# Patient Record
Sex: Male | Born: 1990 | Race: Black or African American | Hispanic: No | Marital: Single | Smoking: Current some day smoker
Health system: Southern US, Community
[De-identification: ages and names within clinical notes are randomized; demographics above are authoritative.]

## PROBLEM LIST (undated history)

## (undated) DIAGNOSIS — N289 Disorder of kidney and ureter, unspecified: Secondary | ICD-10-CM

## (undated) DIAGNOSIS — R519 Headache, unspecified: Secondary | ICD-10-CM

## (undated) DIAGNOSIS — M25569 Pain in unspecified knee: Secondary | ICD-10-CM

## (undated) DIAGNOSIS — Z87442 Personal history of urinary calculi: Secondary | ICD-10-CM

## (undated) DIAGNOSIS — R51 Headache: Secondary | ICD-10-CM

---
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
## 2012-04-28 ENCOUNTER — Emergency Department (HOSPITAL_COMMUNITY)
Admission: EM | Admit: 2012-04-28 | Discharge: 2012-04-28 | Disposition: A | Discharge status: Home / Self Care | Payer: BC Managed Care – PPO | Attending: Emergency Medicine | Admitting: Emergency Medicine

## 2012-04-28 ENCOUNTER — Encounter (HOSPITAL_COMMUNITY): Payer: Self-pay | Admitting: *Deleted

## 2012-04-28 ENCOUNTER — Emergency Department (HOSPITAL_COMMUNITY): Payer: BC Managed Care – PPO

## 2012-04-28 DIAGNOSIS — Z87442 Personal history of urinary calculi: Secondary | ICD-10-CM | POA: Insufficient documentation

## 2012-04-28 DIAGNOSIS — N201 Calculus of ureter: Secondary | ICD-10-CM

## 2012-04-28 DIAGNOSIS — Z8719 Personal history of other diseases of the digestive system: Secondary | ICD-10-CM | POA: Insufficient documentation

## 2012-04-28 LAB — URINALYSIS, ROUTINE W REFLEX MICROSCOPIC
Bilirubin Urine: NEGATIVE
Glucose, UA: NEGATIVE mg/dL
Ketones, ur: NEGATIVE mg/dL
Nitrite: NEGATIVE
pH: 6 (ref 5.0–8.0)

## 2012-04-28 LAB — BASIC METABOLIC PANEL
BUN: 12 mg/dL (ref 6–23)
Calcium: 10 mg/dL (ref 8.4–10.5)
GFR calc non Af Amer: >90 mL/min (ref >90)
Glucose, Bld: 94 mg/dL (ref 70–99)

## 2012-04-28 LAB — URINE MICROSCOPIC-ADD ON

## 2012-04-28 MED ORDER — MORPHINE SULFATE 4 MG/ML IJ SOLN
4.0000 mg | Freq: Once | INTRAMUSCULAR | Status: DC
  Filled 2012-04-28: qty 1

## 2012-04-28 MED ORDER — OXYCODONE-ACETAMINOPHEN 5-325 MG PO TABS: 2.0000 | ORAL_TABLET | ORAL | Status: DC | PRN

## 2012-04-28 MED ORDER — CIPROFLOXACIN HCL 500 MG PO TABS: 500.0000 mg | ORAL_TABLET | Freq: Two times a day (BID) | ORAL | Status: DC

## 2012-04-28 MED ORDER — ONDANSETRON HCL 4 MG/2ML IJ SOLN
4.0000 mg | Freq: Once | INTRAMUSCULAR | Status: AC
  Administered 2012-04-28: 4 mg via INTRAVENOUS
  Filled 2012-04-28: qty 2

## 2012-04-28 MED ORDER — SODIUM CHLORIDE 0.9 % IV BOLUS (SEPSIS)
1000.0000 mL | Freq: Once | INTRAVENOUS | Status: AC
  Administered 2012-04-28: 1000 mL via INTRAVENOUS

## 2012-04-28 NOTE — ED Notes (Signed)
JYN:[id number]<AO> Expected date:<BR> Expected time:<BR> Means of arrival:<BR> Comments:<BR> Kidney stone

## 2012-04-28 NOTE — ED Notes (Signed)
Patient complaining of left sided flank pain for about 5 day. States he has a history of gallstones  Seen a doctor about a year ago and and had a  diagnosis of  Gallstones. Patient states the pain is very similar 7/10.

## 2012-04-28 NOTE — ED Provider Notes (Signed)
History     CSN: [id number]  Arrival date & time 04/28/12  1004   First MD Initiated Contact with Patient 04/28/12 1012      Chief Complaint  Patient presents with  . Flank Pain    left side  hx of kidney     (Consider location/radiation/quality/duration/timing/severity/associated sxs/prior treatment) HPI Comments: Pt states that he has had a kidney stone in the past without complication:pt denies fever,vomiting, diarrhea:pt states that he has not seen urology in the past:left flank pain is radiating to the abdomen  Patient is a 22 y.o. male presenting with flank pain.  Flank Pain This is a recurrent problem. The current episode started in the past 7 days. The problem occurs intermittently. The problem has been unchanged. Associated symptoms include abdominal pain. Pertinent negatives include no fever or urinary symptoms. Nothing aggravates the symptoms. He has tried nothing for the symptoms.    Past Medical History  Diagnosis Date  . Gallstones     History reviewed. No pertinent past surgical history.  History reviewed. No pertinent family history.  History  Substance Use Topics  . Smoking status: Never Smoker   . Smokeless tobacco: Not on file  . Alcohol Use: 1.2 oz/week    2 Shots of liquor per week     Comment: patient states he drinks on occasion had 2 shots last night      Review of Systems  Constitutional: Negative for fever.  Respiratory: Negative.   Cardiovascular: Negative.   Gastrointestinal: Positive for abdominal pain.  Genitourinary: Positive for flank pain.    Allergies  Review of patient's allergies indicates no known allergies.  Home Medications  No current outpatient prescriptions on file.  BP 133/75  Pulse 84  Temp(Src) 97.6 F (36.4 C) (Oral)  Resp 18  SpO2 100%  Physical Exam  Nursing note and vitals reviewed. Constitutional: He is oriented to person, place, and time. He appears well-developed and well-nourished.  HENT:  Head:  Normocephalic and atraumatic.  Eyes: Conjunctivae are normal.  Neck: Neck supple.  Cardiovascular: Normal rate and regular rhythm.   Pulmonary/Chest: Effort normal and breath sounds normal.  Abdominal: Soft. Bowel sounds are normal. There is no tenderness.  Musculoskeletal: Normal range of motion.  Neurological: He is alert and oriented to person, place, and time.  Skin: Skin is warm and dry.  Psychiatric: He has a normal mood and affect.    ED Course  Procedures (including critical care time)  Labs Reviewed  URINALYSIS, ROUTINE W REFLEX MICROSCOPIC - Abnormal; Notable for the following:    APPearance CLOUDY (*)    Hgb urine dipstick LARGE (*)    Leukocytes, UA SMALL (*)    All other components within normal limits  URINE MICROSCOPIC-ADD ON - Abnormal; Notable for the following:    Bacteria, UA FEW (*)    All other components within normal limits  URINE CULTURE  BASIC METABOLIC PANEL   Ct Abdomen Pelvis Wo Contrast  04/28/2012  *RADIOLOGY REPORT*  Clinical Data: Flank pain  CT ABDOMEN AND PELVIS WITHOUT CONTRAST  Technique:  Multidetector CT imaging of the abdomen and pelvis was performed following the standard protocol without intravenous contrast.  Comparison: 09/14/2009  Findings: Lung bases are clear.  No pericardial or pleural effusion.  There is no liver abnormality.  The gallbladder is normal.  No biliary dilatation.  The pancreas is unremarkable. The spleen is normal.  Normal appearance of the adrenal glands.  The right kidney is normal.  No right-sided nephrolithiasis  or hydronephrosis.  No hydroureter or ureterolithiasis. There is asymmetric left-sided hydronephrosis.  At the left UPJ there is a 9 mm stone, image 32/series 2.  Urinary bladder appears normal.  Prostate gland and seminal vesicles are unremarkable.  The abdominal aorta has a normal caliber.  There is no enlarged lymph nodes within the upper abdomen or pelvis.  No free fluid or fluid collections noted.  The stomach  and the small bowel loops have a normal course and caliber.  The appendix is visualized and appears normal.  The colon is unremarkable.  Review of the visualized osseous structures is unremarkable.  IMPRESSION:  1.  Left UPJ stone measures 9 mm and results in left-sided hydronephrosis.   Original Report Authenticated By: Signa Kell, M.D.      1. Ureteral stone       MDM  Pt is feeling better at this time:pt is okay to follow up with urology:possible early infection:will treat and send urine for culture        Teressa Lower, NP 04/28/12 1145

## 2012-04-28 NOTE — ED Notes (Signed)
Patient transported to CT 

## 2012-04-28 NOTE — ED Provider Notes (Signed)
Medical screening examination/treatment/procedure(s) were performed by non-physician practitioner and as supervising physician I was immediately available for consultation/collaboration.   Richardean Canal, MD 04/28/12 1249

## 2012-04-29 LAB — URINE CULTURE: Colony Count: 4000

## 2012-07-08 ENCOUNTER — Encounter (HOSPITAL_COMMUNITY): Payer: Self-pay

## 2012-07-08 ENCOUNTER — Emergency Department (HOSPITAL_COMMUNITY)
Admission: EM | Admit: 2012-07-08 | Discharge: 2012-07-08 | Disposition: A | Discharge status: Home / Self Care | Payer: BC Managed Care – PPO | Attending: Emergency Medicine | Admitting: Emergency Medicine

## 2012-07-08 DIAGNOSIS — N200 Calculus of kidney: Secondary | ICD-10-CM

## 2012-07-08 LAB — URINALYSIS, ROUTINE W REFLEX MICROSCOPIC
Glucose, UA: NEGATIVE mg/dL
Hgb urine dipstick: NEGATIVE
Nitrite: NEGATIVE
Protein, ur: 30 mg/dL — AB
Urobilinogen, UA: 1 mg/dL (ref 0.0–1.0)
pH: 8 (ref 5.0–8.0)

## 2012-07-08 LAB — URINE MICROSCOPIC-ADD ON

## 2012-07-08 MED ORDER — HYDROMORPHONE HCL PF 1 MG/ML IJ SOLN
1.0000 mg | Freq: Once | INTRAMUSCULAR | Status: AC
  Administered 2012-07-08: 1 mg via INTRAMUSCULAR
  Filled 2012-07-08: qty 1

## 2012-07-08 MED ORDER — ONDANSETRON 8 MG PO TBDP
8.0000 mg | ORAL_TABLET | Freq: Once | ORAL | Status: AC
Start: 2012-07-08 — End: 2012-07-08
  Administered 2012-07-08: 8 mg via ORAL
  Filled 2012-07-08: qty 1

## 2012-07-08 NOTE — ED Provider Notes (Signed)
History     CSN: [id number]  Arrival date & time 07/08/12  1744   First MD Initiated Contact with Patient 07/08/12 1758      Chief Complaint  Patient presents with  . Flank Pain    (Consider location/radiation/quality/duration/timing/severity/associated sxs/prior treatment) HPI Comments: 22 y/o male with a PMHx of kidney stones presents to the ED complaining of sudden onset left flank pain beginning around 11:00 today while at home. Describes the pain as sharp, intermittent, beginning towards the left side of his back radiating around his flank rated 10/10. Nothing in specific makes the pain come or go. Currently pain 6/10. He took a percocet that was prescribed at his last ED visit. Denies associated fever, chills, nausea, vomiting, increased urinary frequency or urgency, dysuria or hematuria. States this is similar pain to when he was diagnosed with a kidney stone in March. After looking through patient's chart, he had a 9mm UPJ stone causing hydronephrosis on 3/1. He was given percocet and told to f/u with urology. States he did not need to take the percocet for long as pain subsided on its own, and was unable to follow up with urology due to money reasons. He is going to call urologist tomorrow as he now has insurance.   Patient is a 22 y.o. male presenting with flank pain. The history is provided by the patient.  Flank Pain Pertinent negatives include no chills, fever, nausea or vomiting.    Past Medical History  Diagnosis Date  . Kidney stones   . Kidney stones     History reviewed. No pertinent past surgical history.  History reviewed. No pertinent family history.  History  Substance Use Topics  . Smoking status: Never Smoker   . Smokeless tobacco: Never Used  . Alcohol Use: 1.2 oz/week    2 Shots of liquor per week     Comment: occasional      Review of Systems  Constitutional: Negative for fever and chills.  Gastrointestinal: Negative for nausea and vomiting.    Genitourinary: Positive for flank pain. Negative for dysuria, urgency, frequency, hematuria and difficulty urinating.  All other systems reviewed and are negative.    Allergies  Review of patient's allergies indicates no known allergies.  Home Medications   Current Outpatient Rx  Name  Route  Sig  Dispense  Refill  . ibuprofen (ADVIL,MOTRIN) 200 MG tablet   Oral   Take 200 mg by mouth every 6 (six) hours as needed for pain.         Marland Kitchen oxyCODONE-acetaminophen (PERCOCET/ROXICET) 5-325 MG per tablet   Oral   Take 2 tablets by mouth every 4 (four) hours as needed for pain.   15 tablet   0     BP 129/60  Pulse 85  Temp(Src) 97.7 F (36.5 C) (Oral)  Resp 16  Ht 6\' 8"  (2.032 m)  Wt 210 lb (95.255 kg)  BMI 23.07 kg/m2  SpO2 99%  Physical Exam  Nursing note and vitals reviewed. Constitutional: He is oriented to person, place, and time. He appears well-developed and well-nourished. No distress.  HENT:  Head: Normocephalic and atraumatic.  Mouth/Throat: Oropharynx is clear and moist.  Eyes: Conjunctivae are normal.  Neck: Normal range of motion. Neck supple.  Cardiovascular: Normal rate, regular rhythm, normal heart sounds and intact distal pulses.   Pulmonary/Chest: Effort normal and breath sounds normal. No respiratory distress.  Abdominal: Soft. Normal appearance and bowel sounds are normal. There is tenderness. There is CVA tenderness (left). There  is no rigidity, no rebound and no guarding.    Musculoskeletal: Normal range of motion. He exhibits no edema.  Neurological: He is alert and oriented to person, place, and time.  Skin: Skin is warm and dry. He is not diaphoretic.  Psychiatric: He has a normal mood and affect. His behavior is normal.    ED Course  Procedures (including critical care time)  Labs Reviewed  URINALYSIS, ROUTINE W REFLEX MICROSCOPIC - Abnormal; Notable for the following:    APPearance TURBID (*)    Specific Gravity, Urine 1.035 (*)     Protein, ur 30 (*)    All other components within normal limits  URINE MICROSCOPIC-ADD ON - Abnormal; Notable for the following:    Bacteria, UA FEW (*)    All other components within normal limits   No results found.   1. Kidney stone       MDM  22 y/o male with L flank pain, known kidney stone he did not f/u with urology for. He is in NAD. Pain 6/10 in ED, decreased to 3/10 with dilaudid. U/A without infection. Vitals stable, NAD. Repeat abdominal exam without any tenderness, no CVA tenderness. Pain returned at discharge, another dose of dilaudid given. He has percocet at home. Will call urology tomorrow for f/u. Return precautions discussed. Patient states understanding of plan and is agreeable.      Trevor Mace, PA-C 07/08/12 2055  Trevor Mace, PA-C 07/08/12 2055

## 2012-07-08 NOTE — ED Provider Notes (Signed)
Medical screening examination/treatment/procedure(s) were performed by non-physician practitioner and as supervising physician I was immediately available for consultation/collaboration.  Gilda Crease, MD 07/08/12 [phone]

## 2012-07-08 NOTE — ED Notes (Signed)
Pt is aware that we been urine.  Unable to provide a sample at this time.

## 2012-07-08 NOTE — ED Notes (Signed)
Patient c/o left flank pain that began at 1100 today. Patient denies N/V/D or hematuria. Patient has a history of kidney stones.

## 2012-12-15 ENCOUNTER — Emergency Department (HOSPITAL_COMMUNITY)
Admission: EM | Admit: 2012-12-15 | Discharge: 2012-12-15 | Disposition: A | Discharge status: Home / Self Care | Payer: BC Managed Care – PPO | Attending: Emergency Medicine | Admitting: Emergency Medicine

## 2012-12-15 ENCOUNTER — Encounter (HOSPITAL_COMMUNITY): Payer: Self-pay | Admitting: Emergency Medicine

## 2012-12-15 ENCOUNTER — Emergency Department (HOSPITAL_COMMUNITY): Payer: BC Managed Care – PPO

## 2012-12-15 DIAGNOSIS — Y939 Activity, unspecified: Secondary | ICD-10-CM | POA: Insufficient documentation

## 2012-12-15 DIAGNOSIS — S63502A Unspecified sprain of left wrist, initial encounter: Secondary | ICD-10-CM

## 2012-12-15 DIAGNOSIS — S21209A Unspecified open wound of unspecified back wall of thorax without penetration into thoracic cavity, initial encounter: Secondary | ICD-10-CM | POA: Insufficient documentation

## 2012-12-15 DIAGNOSIS — T07XXXA Unspecified multiple injuries, initial encounter: Secondary | ICD-10-CM

## 2012-12-15 DIAGNOSIS — Z87442 Personal history of urinary calculi: Secondary | ICD-10-CM | POA: Insufficient documentation

## 2012-12-15 DIAGNOSIS — W138XXA Fall from, out of or through other building or structure, initial encounter: Secondary | ICD-10-CM | POA: Insufficient documentation

## 2012-12-15 DIAGNOSIS — IMO0002 Reserved for concepts with insufficient information to code with codable children: Secondary | ICD-10-CM | POA: Insufficient documentation

## 2012-12-15 DIAGNOSIS — Y9289 Other specified places as the place of occurrence of the external cause: Secondary | ICD-10-CM | POA: Insufficient documentation

## 2012-12-15 DIAGNOSIS — S63509A Unspecified sprain of unspecified wrist, initial encounter: Secondary | ICD-10-CM | POA: Insufficient documentation

## 2012-12-15 DIAGNOSIS — W19XXXA Unspecified fall, initial encounter: Secondary | ICD-10-CM
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
## 2012-12-15 NOTE — ED Notes (Signed)
Pt states he fell off a second story balcony landing on a bush then concrete.  +left wrist pain, +right knee pain.

## 2012-12-15 NOTE — ED Notes (Signed)
Pt. Refused for lab works, ethanol.MD aware.

## 2012-12-15 NOTE — ED Notes (Signed)
Pt. Refused lab draw at this time. RN,Lilibeth made aware.

## 2013-10-18 ENCOUNTER — Ambulatory Visit (INDEPENDENT_AMBULATORY_CARE_PROVIDER_SITE_OTHER): Payer: BC Managed Care – PPO | Admitting: Family Medicine

## 2013-10-18 VITALS — BP 138/74 | HR 70 | Temp 98.1°F | Resp 16 | Ht >= 80 in | Wt 201.2 lb

## 2013-10-18 DIAGNOSIS — H109 Unspecified conjunctivitis: Secondary | ICD-10-CM

## 2013-10-18 MED ORDER — TOBRAMYCIN 0.3 % OP SOLN: 1.0000 [drp] | Freq: Four times a day (QID) | OPHTHALMIC | Status: DC

## 2013-10-18 NOTE — Progress Notes (Signed)
   Subjective:    Patient ID: Brian Juarez, male    DOB: 05-18-1990, 23 y.o.   MRN: [id number]  H[place] 23 [place] several days of redness, irritation, discharge from his left eye. He's having no significant change in his vision and no eye pain.   Review of Systems    no fever   Left eye shows purulent, copious discharge with diffuse erythema sparing the margin of the limbus. Fundus is num will Objective:   Physical Exam  Left eye shows diffuse erythema and discharge with mild lid swelling Fundus is normal  No pretragal node adenopathy    Assessment & Plan:  Conjunctivitis of left eye - Plan: tobramycin (TOBREX) 0.3 % ophthalmic solution  Signed, Elvina SidleKurt Inri Sobieski, MD

## 2013-10-18 NOTE — Patient Instructions (Signed)

## 2013-11-26 ENCOUNTER — Emergency Department (HOSPITAL_COMMUNITY): Payer: BC Managed Care – PPO

## 2013-11-26 ENCOUNTER — Encounter (HOSPITAL_COMMUNITY): Payer: Self-pay | Admitting: Emergency Medicine

## 2013-11-26 ENCOUNTER — Emergency Department (HOSPITAL_COMMUNITY)
Admission: EM | Admit: 2013-11-26 | Discharge: 2013-11-26 | Disposition: A | Discharge status: Home / Self Care | Payer: BC Managed Care – PPO | Attending: Emergency Medicine | Admitting: Emergency Medicine

## 2013-11-26 DIAGNOSIS — Z792 Long term (current) use of antibiotics: Secondary | ICD-10-CM | POA: Diagnosis not present

## 2013-11-26 DIAGNOSIS — W010XXA Fall on same level from slipping, tripping and stumbling without subsequent striking against object, initial encounter: Secondary | ICD-10-CM | POA: Diagnosis not present

## 2013-11-26 DIAGNOSIS — Y9289 Other specified places as the place of occurrence of the external cause: Secondary | ICD-10-CM | POA: Diagnosis not present

## 2013-11-26 DIAGNOSIS — S8990XA Unspecified injury of unspecified lower leg, initial encounter: Secondary | ICD-10-CM | POA: Insufficient documentation

## 2013-11-26 DIAGNOSIS — M25469 Effusion, unspecified knee: Secondary | ICD-10-CM | POA: Insufficient documentation

## 2013-11-26 DIAGNOSIS — S99929A Unspecified injury of unspecified foot, initial encounter: Secondary | ICD-10-CM

## 2013-11-26 DIAGNOSIS — M25462 Effusion, left knee: Secondary | ICD-10-CM

## 2013-11-26 DIAGNOSIS — S99919A Unspecified injury of unspecified ankle, initial encounter: Secondary | ICD-10-CM

## 2013-11-26 DIAGNOSIS — Y9389 Activity, other specified: Secondary | ICD-10-CM | POA: Insufficient documentation

## 2013-11-26 MED ORDER — NAPROXEN 500 MG PO TABS: 500.0000 mg | ORAL_TABLET | Freq: Two times a day (BID) | ORAL | Status: DC

## 2013-11-26 NOTE — Discharge Instructions (Signed)

## 2013-11-26 NOTE — ED Provider Notes (Signed)
CSN: [id number]     Arrival date & time 11/26/13  1254 History  This chart was scribed for non-physician practitioner, Joycie Peek, PA-C, working with Gerhard Munch, MD by Charline Bills, ED Scribe. This patient was seen in room WTR7/WTR7 and the patient's care was started at 2:38 PM.   Chief Complaint  Patient presents with  . Knee Injury   The history is provided by the patient. No language interpreter was used.   HPI Comments: Brian Juarez is a 23 y.o. male who presents to the Emergency Department complaining of L knee injury sustained yesterday. Pt states that he slipped and fell on a wet floor during workouts. Pt reports hitting his knee on the floor. He reports injury to the same knee approximately 7 weeks ago. Quick movements aggravate discomfort and rest aleives discomfort. Pt presents with associated mild pain and swelling. He denies abdominal pain and any other symptoms at this time.  History reviewed. No pertinent past medical history. History reviewed. No pertinent past surgical history. Family History  Problem Relation Age of Onset  . Stroke Mother   . Hypertension Mother   . Heart disease Father   . Hyperlipidemia Father   . Hypertension Father   . Stroke Father   . Diabetes Maternal Grandmother   . Heart disease Maternal Grandmother   . Hyperlipidemia Maternal Grandmother   . Hypertension Maternal Grandmother   . Stroke Maternal Grandmother   . Cancer Maternal Grandfather   . Hyperlipidemia Maternal Grandfather   . Diabetes Paternal Grandmother   . Hyperlipidemia Paternal Grandmother   . Stroke Paternal Grandmother   . Hyperlipidemia Paternal Grandfather   . Hypertension Paternal Grandfather    History  Substance Use Topics  . Smoking status: Never Smoker   . Smokeless tobacco: Never Used  . Alcohol Use: 1.2 oz/week    2 Shots of liquor per week     Comment: occasional    Review of Systems  Gastrointestinal: Negative for abdominal pain.   Musculoskeletal: Positive for arthralgias and joint swelling.  All other systems reviewed and are negative.  Allergies  Review of patient's allergies indicates no known allergies.  Home Medications   Prior to Admission medications   Medication Sig Start Date End Date Taking? Authorizing Provider  naproxen (NAPROSYN) 500 MG tablet Take 1 tablet (500 mg total) by mouth 2 (two) times daily. 11/26/13   Earle Gell Shiane Wenberg, PA-C  tobramycin (TOBREX) 0.3 % ophthalmic solution Place 1 drop into the left eye every 6 (six) hours. 10/18/13   Elvina Sidle, MD   Triage Vitals: BP 132/76  Pulse 83  Temp(Src) 97.4 F (36.3 C) (Oral)  Resp 18  SpO2 99% Physical Exam  Nursing note and vitals reviewed. Constitutional: He is oriented to person, place, and time. He appears well-developed and well-nourished. No distress.  HENT:  Head: Normocephalic and atraumatic.  Eyes: Conjunctivae and EOM are normal.  Neck: Neck supple. No tracheal deviation present.  Cardiovascular: Normal rate and regular rhythm.   Pulmonary/Chest: Effort normal and breath sounds normal. No respiratory distress.  Musculoskeletal: Normal range of motion.       Left knee: He exhibits swelling and effusion.  L knee: Mild effusion  Active ROM, limited due to discomfort Passive ROM appropriate No ligamentous laxity Pt is able to ambulate independently with somewhat antalgic gait Without any apparent ataxia. No appreciable erythema, warmth or overt swelling. No evidence for septic joint or hemarthrosis  Neurological: He is alert and oriented to person, place, and  time.  Skin: Skin is warm and dry.  Psychiatric: He has a normal mood and affect. His behavior is normal.   ED Course  Procedures (including critical care time) DIAGNOSTIC STUDIES: Oxygen Saturation is 99% on RA, normal by my interpretation.    COORDINATION OF CARE: 2:45 PM-Discussed treatment plan which includes XR and RICE with pt at bedside and pt agreed to  plan.   Labs Review Labs Reviewed - No data to display  Imaging Review Dg Knee Complete 4 Views Left  11/26/2013   CLINICAL DATA:  Left knee injury, pain  EXAM: LEFT KNEE - COMPLETE 4+ VIEW  COMPARISON:  None.  FINDINGS: Normal alignment without acute fracture. Large effusion on the lateral view superior to the patella. Preserved joint spaces. No significant arthropathy. Mild soft tissue swelling.  IMPRESSION: Large left knee joint effusion.  No acute osseous finding.   Electronically Signed   By: Ruel Favorsrevor  Shick M.D.   On: 11/26/2013 13:59    EKG Interpretation None      MDM  Pt resting comfortably. Vitals stable - WNL - afebrile Knee discomfort with associated mild effusion. Xray confirms no frx or dislocation evident. Recommended NSAIDS, RICE therapy and return to dynamic activity slowly. Provided ace wrap in ED. Discussed plan of care, f/u with PCP and return precautions. Pt amenable to plan. Pt stable, in good condition and is appropriate for discharge.. Final diagnoses:  Knee effusion, left      I personally performed the services described in this documentation, which was scribed in my presence. The recorded information has been reviewed and is accurate.  l  Sharlene MottsBenjamin W Marzetta Lanza, PA-C 11/27/13 1056

## 2013-11-26 NOTE — ED Notes (Signed)
Pt was at practice yesterday and fell to the floor where his knee made impact with the floor.  Patient has a hx of injury to the same knee, which occurred approx 6-8 weeks.  Pt received a scan of some sort, and "nothing was found" and he was directed to ice his knee and "stay off of it'.  Pt is concerned about long-term health of his knee for sports reasons, and would like "medecines" to help with symptoms.

## 2013-11-27 NOTE — ED Provider Notes (Signed)
Medical screening examination/treatment/procedure(s) were performed by non-physician practitioner and as supervising physician I was immediately available for consultation/collaboration.  Wilburn Keir, MD 11/27/13 1545 

## 2013-12-31 ENCOUNTER — Encounter (HOSPITAL_COMMUNITY): Payer: Self-pay | Admitting: Emergency Medicine

## 2013-12-31 ENCOUNTER — Emergency Department (HOSPITAL_COMMUNITY)
Admission: EM | Admit: 2013-12-31 | Discharge: 2013-12-31 | Disposition: A | Discharge status: Home / Self Care | Payer: BC Managed Care – PPO | Attending: Emergency Medicine | Admitting: Emergency Medicine

## 2013-12-31 DIAGNOSIS — Z79899 Other long term (current) drug therapy: Secondary | ICD-10-CM | POA: Diagnosis not present

## 2013-12-31 DIAGNOSIS — M25562 Pain in left knee: Secondary | ICD-10-CM

## 2013-12-31 DIAGNOSIS — H6121 Impacted cerumen, right ear: Secondary | ICD-10-CM

## 2013-12-31 HISTORY — DX: Pain in unspecified knee: M25.569

## 2013-12-31 MED ORDER — CARBAMIDE PEROXIDE 6.5 % OT SOLN: 5.0000 [drp] | Freq: Two times a day (BID) | OTIC | Status: DC

## 2013-12-31 MED ORDER — NAPROXEN 500 MG PO TABS: 500.0000 mg | ORAL_TABLET | Freq: Two times a day (BID) | ORAL | Status: DC

## 2013-12-31 NOTE — Discharge Instructions (Signed)
Take naproxen as directed for your knee pain. Use the knee sleeve. Rest, ice and elevate your knee. Follow-up with orthopedist. Use debrox for your ear.  Cerumen Impaction A cerumen impaction is when the wax in your ear forms a plug. This plug usually causes reduced hearing. Sometimes it also causes an earache or dizziness. Removing a cerumen impaction can be difficult and painful. The wax sticks to the ear canal. The canal is sensitive and bleeds easily. If you try to remove a heavy wax buildup with a cotton tipped swab, you may push it in further. Irrigation with water, suction, and small ear curettes may be used to clear out the wax. If the impaction is fixed to the skin in the ear canal, ear drops may be needed for a few days to loosen the wax. People who build up a lot of wax frequently can use ear wax removal products available in your local drugstore. SEEK MEDICAL CARE IF:  You develop an earache, increased hearing loss, or marked dizziness. Document Released: 03/24/2004 Document Revised: 05/09/2011 Document Reviewed: 05/14/2009 [facility] Patient Information 2015 [place], [place]. This information is not intended to replace advice given to you by your health care provider. Make sure you discuss any questions you have with your health care provider.  Knee Pain The knee is the complex joint between your thigh and your lower leg. It is made up of bones, tendons, ligaments, and cartilage. The bones that make up the knee are:  The femur in the thigh.  The tibia and fibula in the lower leg.  The patella or kneecap riding in the groove on the lower femur. CAUSES  Knee pain is a common complaint with many causes. A few of these causes are:  Injury, such as:  A ruptured ligament or tendon injury.  Torn cartilage.  Medical conditions, such as:  Gout  Arthritis  Infections  Overuse, over training, or overdoing a physical activity. Knee pain can be minor or severe. Knee pain can  accompany debilitating injury. Minor knee problems often respond well to self-care measures or get well on their own. More serious injuries may need medical intervention or even surgery. SYMPTOMS The knee is complex. Symptoms of knee problems can vary widely. Some of the problems are:  Pain with movement and weight bearing.  Swelling and tenderness.  Buckling of the knee.  Inability to straighten or extend your knee.  Your knee locks and you cannot straighten it.  Warmth and redness with pain and fever.  Deformity or dislocation of the kneecap. DIAGNOSIS  Determining what is wrong may be very straight forward such as when there is an injury. It can also be challenging because of the complexity of the knee. Tests to make a diagnosis may include:  Your caregiver taking a history and doing a physical exam.  Routine X-rays can be used to rule out other problems. X-rays will not reveal a cartilage tear. Some injuries of the knee can be diagnosed by:  Arthroscopy a surgical technique by which a small video camera is inserted through tiny incisions on the sides of the knee. This procedure is used to examine and repair internal knee joint problems. Tiny instruments can be used during arthroscopy to repair the torn knee cartilage (meniscus).  Arthrography is a radiology technique. A contrast liquid is directly injected into the knee joint. Internal structures of the knee joint then become visible on X-ray film.  An MRI scan is a non X-ray radiology procedure in which magnetic  fields and a computer produce two- or three-dimensional images of the inside of the knee. Cartilage tears are often visible using an MRI scanner. MRI scans have largely replaced arthrography in diagnosing cartilage tears of the knee.  Blood work.  Examination of the fluid that helps to lubricate the knee joint (synovial fluid). This is done by taking a sample out using a needle and a syringe. TREATMENT The treatment of  knee problems depends on the cause. Some of these treatments are:  Depending on the injury, proper casting, splinting, surgery, or physical therapy care will be needed.  Give yourself adequate recovery time. Do not overuse your joints. If you begin to get sore during workout routines, back off. Slow down or do fewer repetitions.  For repetitive activities such as cycling or running, maintain your strength and nutrition.  Alternate muscle groups. For example, if you are a weight lifter, work the upper body on one day and the lower body the next.  Either tight or weak muscles do not give the proper support for your knee. Tight or weak muscles do not absorb the stress placed on the knee joint. Keep the muscles surrounding the knee strong.  Take care of mechanical problems.  If you have flat feet, orthotics or special shoes may help. See your caregiver if you need help.  Arch supports, sometimes with wedges on the inner or outer aspect of the heel, can help. These can shift pressure away from the side of the knee most bothered by osteoarthritis.  A brace called an "unloader" brace also may be used to help ease the pressure on the most arthritic side of the knee.  If your caregiver has prescribed crutches, braces, wraps or ice, use as directed. The acronym for this is PRICE. This means protection, rest, ice, compression, and elevation.  Nonsteroidal anti-inflammatory drugs (NSAIDs), can help relieve pain. But if taken immediately after an injury, they may actually increase swelling. Take NSAIDs with food in your stomach. Stop them if you develop stomach problems. Do not take these if you have a history of ulcers, stomach pain, or bleeding from the bowel. Do not take without your caregiver's approval if you have problems with fluid retention, heart failure, or kidney problems.  For ongoing knee problems, physical therapy may be helpful.  Glucosamine and chondroitin are over-the-counter dietary  supplements. Both may help relieve the pain of osteoarthritis in the knee. These medicines are different from the usual anti-inflammatory drugs. Glucosamine may decrease the rate of cartilage destruction.  Injections of a corticosteroid drug into your knee joint may help reduce the symptoms of an arthritis flare-up. They may provide pain relief that lasts a few months. You may have to wait a few months between injections. The injections do have a small increased risk of infection, water retention, and elevated blood sugar levels.  Hyaluronic acid injected into damaged joints may ease pain and provide lubrication. These injections may work by reducing inflammation. A series of shots may give relief for as long as 6 months.  Topical painkillers. Applying certain ointments to your skin may help relieve the pain and stiffness of osteoarthritis. Ask your pharmacist for suggestions. Many over the-counter products are approved for temporary relief of arthritis pain.  In some countries, doctors often prescribe topical NSAIDs for relief of chronic conditions such as arthritis and tendinitis. A review of treatment with NSAID creams found that they worked as well as oral medications but without the serious side effects. PREVENTION  Maintain a  healthy weight. Extra pounds put more strain on your joints.  Get strong, stay limber. Weak muscles are a common cause of knee injuries. Stretching is important. Include flexibility exercises in your workouts.  Be smart about exercise. If you have osteoarthritis, chronic knee pain or recurring injuries, you may need to change the way you exercise. This does not mean you have to stop being active. If your knees ache after jogging or playing basketball, consider switching to swimming, water aerobics, or other low-impact activities, at least for a few days a week. Sometimes limiting high-impact activities will provide relief.  Make sure your shoes fit well. Choose footwear  that is right for your sport.  Protect your knees. Use the proper gear for knee-sensitive activities. Use kneepads when playing volleyball or laying carpet. Buckle your seat belt every time you drive. Most shattered kneecaps occur in car accidents.  Rest when you are tired. SEEK MEDICAL CARE IF:  You have knee pain that is continual and does not seem to be getting better.  SEEK IMMEDIATE MEDICAL CARE IF:  Your knee joint feels hot to the touch and you have a high fever. MAKE SURE YOU:   Understand these instructions.  Will watch your condition.  Will get help right away if you are not doing well or get worse. Document Released: 12/12/2006 Document Revised: 05/09/2011 Document Reviewed: 12/12/2006 [facility] Patient Information 2015 [place]. This information is not intended to replace advice given to you by your health care provider. Make sure you discuss any questions you have with your health care provider.  Knee Bracing Knee braces are supports to help stabilize and protect an injured or painful knee. They come in many different styles. They should support and protect the knee without increasing the chance of other injuries to yourself or others. It is important not to have a false sense of security when using a brace. Knee braces that help you to keep using your knee:  Do not restore normal knee stability under high stress forces.  May decrease some aspects of athletic performance. Some of the different types of knee braces are:  Prophylactic knee braces are designed to prevent or reduce the severity of knee injuries during sports that make injury to the knee more likely.  Rehabilitative knee braces are designed to allow protected motion of:  Injured knees.  Knees that have been treated with or without surgery. There is no evidence that the use of a supportive knee brace protects the graft following a successful anterior cruciate ligament (ACL) reconstruction. However,  braces are sometimes used to:   Protect injured ligaments.  Control knee movement during the initial healing period. They may be used as part of the treatment program for the various injured ligaments or cartilage of the knee including the:  Anterior cruciate ligament.  Medial collateral ligament.  Medial or lateral cartilage (meniscus).  Posterior cruciate ligament.  Lateral collateral ligament. Rehabilitative knee braces are most commonly used:  During crutch-assisted walking right after injury.  During crutch-assisted walking right after surgery to repair the cartilage and/or cruciate ligament injury.  For a short period of time, 2-8 weeks, after the injury or surgery. The value of a rehabilitative brace as opposed to a cast or splint includes the:  Ability to adjust the brace for swelling.  Ability to remove the brace for examinations, icing, or showering.  Ability to allow for movement in a controlled range of motion. Functional knee braces give support to knees that have already  been injured. They are designed to provide stability for the injured knee and provide protection after repair. Functional knee braces may not affect performance much. Lower extremity muscle strengthening, flexibility, and improvement in technique are more important than bracing in treating ligamentous knee injuries. Functional braces are not a substitute for rehabilitation or surgical procedures. Unloader/off-loader braces are designed to provide pain relief in arthritic knees. Patients with wear and tear arthritis from growing old or from an old cartilage injury (osteoarthritis) of the knee, and bowlegged (varus) or knock-knee (valgus) deformities, often develop increased pain in the arthritic side due to increased loading. Unloader/off-loader braces are made to reduce uneven loading in such knees. There is reduction in bowing out movement in bowlegged knees when the correct unloader brace is used.  Patients with advanced osteoarthritis or severe varus or valgus alignment problems would not likely benefit from bracing. Patellofemoral braces help the kneecap to move smoothly and well centered over the end of the femur in the knee.  Most people who wear knee braces feel that they help. However, there is a lack of scientific evidence that knee braces are helpful at the level needed for athletic participation to prevent injury. In spite of this, athletes report an increase in knee stability, pain relief, performance improvement, and confidence during athletics when using a brace.  Different knee problems require different knee braces:  Your caregiver may suggest one kind of knee brace after knee surgery.  A caregiver may choose another kind of knee brace for support instead of surgery for some types of torn ligaments.  You may also need one for pain in the front of your knee that is not getting better with strengthening and flexibility exercises. Get your caregiver's advice if you want to try a knee brace. The caregiver will advise you on where to get them and provide a prescription when it is needed to fashion and/or fit the brace. Knee braces are the least important part of preventing knee injuries or getting better following injury. Stretching, strengthening and technique improvement are far more important in caring for and preventing knee injuries. When strengthening your knee, increase your activities a little at a time so as not to develop injuries from overuse. Work out an exercise plan with your caregiver and/or physical therapist to get the best program for you. Do not let a knee brace become a crutch. Always remember, there are no braces which support the knee as well as your original ligaments and cartilage you were born with. Conditioning, proper warm-up, and stretching remain the most important parts of keeping your knees healthy. HOW TO USE A KNEE BRACE  During sports, knee braces  should be used as directed by your caregiver.  Make sure that the hinges are where the knee bends.  Straps, tapes, or hook-and-loop tapes should be fastened around your leg as instructed.  You should check the placement of the brace during activities to make sure that it has not moved. Poorly positioned braces can hurt rather than help you.  To work well, a knee brace should be worn during all activities that put you at risk of knee injury.  Warm up properly before beginning athletic activities. HOME CARE INSTRUCTIONS  Knee braces often get damaged during normal use. Replace worn-out braces for maximum benefit.  Clean regularly with soap and water.  Inspect your brace often for wear and tear.  Cover exposed metal to protect others from injury.  Durable materials may cost more, but last longer. SEEK  IMMEDIATE MEDICAL CARE IF:   Your knee seems to be getting worse rather than better.  You have increasing pain or swelling in the knee.  You have problems caused by the knee brace.  You have increased swelling or inflammation (redness or soreness) in your knee.  Your knee becomes warm and more painful and you develop an unexplained temperature over 101F (38.3C). MAKE SURE YOU:   Understand these instructions.  Will watch your condition.  Will get help right away if you are not doing well or get worse. See your caregiver, physical therapist, or orthopedic surgeon for additional information. Document Released: 05/07/2003 Document Revised: 07/01/2013 Document Reviewed: 08/13/2008 [facility] Patient Information 2015 [place], [place]. This information is not intended to replace advice given to you by your health care provider. Make sure you discuss any questions you have with your health care provider.

## 2013-12-31 NOTE — ED Notes (Addendum)
Pt reports 3 week of increased l/knee weakness.Pt is a basketball player. Xray completed approx. 3 weeks ago.  Also, c/o r/ear pain and decreased hearing x 2 days

## 2013-12-31 NOTE — ED Provider Notes (Signed)
CSN: [id number]     Arrival date & time 12/31/13  1139 History   First MD Initiated Contact with Patient 12/31/13 1143     Chief Complaint  Patient presents with  . Otalgia    pt reports ear r/pain ans hearing loss x 2 days  . Knee Pain    3 week hx of l/knee weakness     (Consider location/radiation/quality/duration/timing/severity/associated sxs/prior Treatment) HPI Comments: This is a [age] year old male who presents to the emergency department complaining of increased left knee pain 3 weeks. Patient was seen in the emergency department at the end of September after injuring his knee, had a an x-ray that showed a large left knee effusion, no other acute findings. He was given naproxen which she was taking with significant relief of his symptoms. 3 weeks ago while he was running he started to experience the pain again, and reports his knee "locking" on him on occasion and feels like it is "slipping". After he rests from running, the pain subsides. Denies any further significant swelling. He did not follow-up with an orthopedist. Patient is also complaining of right ear fullness over the past 2 days with slight decreased hearing. He has been using Q-tips to clean out his ears. Denies fever, chills, ear drainage, cough.  Patient is a 23 y.o. male presenting with ear pain and knee pain. The history is provided by the patient.  Otalgia Knee Pain   Past Medical History  Diagnosis Date  . Knee pain    History reviewed. No pertinent past surgical history. Family History  Problem Relation Age of Onset  . Stroke Mother   . Hypertension Mother   . Heart disease Father   . Hyperlipidemia Father   . Hypertension Father   . Stroke Father   . Diabetes Maternal Grandmother   . Heart disease Maternal Grandmother   . Hyperlipidemia Maternal Grandmother   . Hypertension Maternal Grandmother   . Stroke Maternal Grandmother   . Cancer Maternal Grandfather   . Hyperlipidemia Maternal Grandfather    . Diabetes Paternal Grandmother   . Hyperlipidemia Paternal Grandmother   . Stroke Paternal Grandmother   . Hyperlipidemia Paternal Grandfather   . Hypertension Paternal Grandfather    History  Substance Use Topics  . Smoking status: Never Smoker   . Smokeless tobacco: Never Used  . Alcohol Use: 1.2 oz/week    2 Shots of liquor per week     Comment: occasional    Review of Systems  HENT: Positive for ear pain.    10 Systems reviewed and are negative for acute change except as noted in the HPI.   Allergies  Review of patient's allergies indicates no known allergies.  Home Medications   Prior to Admission medications   Medication Sig Start Date End Date Taking? Authorizing Provider  carbamide peroxide (DEBROX) 6.5 % otic solution Place 5 drops into both ears 2 (two) times daily. 12/31/13   Evalise Abruzzese M Andrey Hoobler, PA-C  naproxen (NAPROSYN) 500 MG tablet Take 1 tablet (500 mg total) by mouth 2 (two) times daily. 12/31/13   Chadd Tollison M Kaulana Brindle, PA-C  tobramycin (TOBREX) 0.3 % ophthalmic solution Place 1 drop into the left eye every 6 (six) hours. 10/18/13   Elvina SidleKurt Lauenstein, MD   BP 120/90 mmHg  Pulse 69  Temp(Src) 98 F (36.7 C) (Oral)  Resp 16  Wt 215 lb (97.523 kg)  SpO2 100% Physical Exam  Constitutional: He is oriented to person, place, and time. He appears well-developed and well-nourished.  No distress.  HENT:  Head: Normocephalic and atraumatic.  R ear with moderate amount of cerumen. TM visible and normal. No drainage. L ear normal.  Eyes: Conjunctivae and EOM are normal.  Neck: Normal range of motion. Neck supple.  Cardiovascular: Normal rate, regular rhythm and normal heart sounds.   Pulmonary/Chest: Effort normal and breath sounds normal.  Musculoskeletal: Normal range of motion. He exhibits no edema.  L knee non-tender. No swelling or deformity. No ligamentous laxity. FROM. Normal gait.  Neurological: He is alert and oriented to person, place, and time.  Skin: Skin is warm and  dry.  Psychiatric: He has a normal mood and affect. His behavior is normal.  Nursing note and vitals reviewed.   ED Course  Procedures (including critical care time) Ceruminosis is noted.  Wax is removed by syringing and manual debridement. Instructions for home care to prevent wax buildup are given.  Labs Review Labs Reviewed - No data to display  Imaging Review No results found.   EKG Interpretation None      MDM   Final diagnoses:  Left knee pain  Right ear impacted cerumen   Patient in no apparent distress. Vital signs stable. Neurovascularly intact. Prescription for naproxen given along with knee sleeve, and orthopedic follow-up. Right ear irrigated for cerumen removal, prescription for debrox. Advised against the use of Q-tips in his ear. Stable for discharge. Return precautions given. Patient states understanding of treatment care plan and is agreeable.   Kathrynn SpeedRobyn M Jamilya Sarrazin, PA-C 12/31/13 1218

## 2014-02-20 ENCOUNTER — Other Ambulatory Visit (HOSPITAL_COMMUNITY): Payer: Self-pay | Admitting: Orthopedic Surgery

## 2014-03-03 ENCOUNTER — Encounter (HOSPITAL_COMMUNITY): Payer: Self-pay

## 2014-03-03 ENCOUNTER — Encounter (HOSPITAL_COMMUNITY)
Admission: RE | Admit: 2014-03-03 | Discharge: 2014-03-03 | Disposition: A | Discharge status: Home / Self Care | Payer: BLUE CROSS/BLUE SHIELD | Source: Ambulatory Visit | Attending: Orthopedic Surgery | Admitting: Orthopedic Surgery

## 2014-03-03 DIAGNOSIS — Y9367 Activity, basketball: Secondary | ICD-10-CM | POA: Diagnosis not present

## 2014-03-03 DIAGNOSIS — Y92310 Basketball court as the place of occurrence of the external cause: Secondary | ICD-10-CM | POA: Diagnosis not present

## 2014-03-03 DIAGNOSIS — S83512A Sprain of anterior cruciate ligament of left knee, initial encounter: Secondary | ICD-10-CM | POA: Diagnosis present

## 2014-03-03 DIAGNOSIS — S83242A Other tear of medial meniscus, current injury, left knee, initial encounter: Secondary | ICD-10-CM | POA: Diagnosis not present

## 2014-03-03 DIAGNOSIS — F17210 Nicotine dependence, cigarettes, uncomplicated: Secondary | ICD-10-CM | POA: Diagnosis not present

## 2014-03-03 DIAGNOSIS — Z87442 Personal history of urinary calculi: Secondary | ICD-10-CM | POA: Diagnosis not present

## 2014-03-03 HISTORY — DX: Headache, unspecified: R51.9

## 2014-03-03 HISTORY — DX: Headache: R51

## 2014-03-03 HISTORY — DX: Personal history of urinary calculi: Z87.442

## 2014-03-03 LAB — CBC
HEMATOCRIT: 46.8 % (ref 39.0–52.0)
Hemoglobin: 15.6 g/dL (ref 13.0–17.0)
MCH: 29 pg (ref 26.0–34.0)
MCHC: 33.3 g/dL (ref 30.0–36.0)
MCV: 87 fL (ref 78.0–100.0)
Platelets: 202 10*3/uL (ref 150–400)
RBC: 5.38 MIL/uL (ref 4.22–5.81)
RDW: 13.1 % (ref 11.5–15.5)
WBC: 5.1 10*3/uL (ref 4.0–10.5)

## 2014-03-03 MED ORDER — CHLORHEXIDINE GLUCONATE 4 % EX LIQD: 60.0000 mL | Freq: Once | CUTANEOUS | Status: DC

## 2014-03-03 MED ORDER — CEFAZOLIN SODIUM-DEXTROSE 2-3 GM-% IV SOLR
2.0000 g | INTRAVENOUS | Status: AC
  Administered 2014-03-04 (×2): 2 g via INTRAVENOUS

## 2014-03-03 NOTE — Pre-Procedure Instructions (Signed)
Brian Juarez  03/03/2014   Your procedure is scheduled on:  Tues, Jan 5 @ 11:20 AM  Report to [facility]  at 9:15 AM.  Call this number if you have problems the morning of surgery: [phone]   Remember:   Do not eat food or drink liquids after midnight.               No Goody's,BC's,Aleve,Aspirin,Ibuprofen,Fish Oil,or any Herbal Medications   Do not wear jewelry  Do not wear lotions, powders, or colognes. You may wear deodorant.  Men may shave face and neck.  Do not bring valuables to the hospital.  [facility] is not responsible                  for any belongings or valuables.               Contacts, dentures or bridgework may not be worn into surgery.  Leave suitcase in the car. After surgery it may be brought to your room.  For patients admitted to the hospital, discharge time is determined by your                treatment team.               Patients discharged the day of surgery will not be allowed to drive  home.    Special Instructions:  [facility] - Preparing for Surgery  Before surgery, you can play an important role.  Because skin is not sterile, your skin needs to be as free of germs as possible.  You can reduce the number of germs on you skin by washing with CHG (chlorahexidine gluconate) soap before surgery.  CHG is an antiseptic cleaner which kills germs and bonds with the skin to continue killing germs even after washing.  Please DO NOT use if you have an allergy to CHG or antibacterial soaps.  If your skin becomes reddened/irritated stop using the CHG and inform your nurse when you arrive at Short Stay.  Do not shave (including legs and underarms) for at least 48 hours prior to the first CHG shower.  You may shave your face.  Please follow these instructions carefully:   1.  Shower with CHG Soap the night before surgery and the                                morning of Surgery.  2.  If you choose to wash your hair, wash your hair first as usual  with your       normal shampoo.  3.  After you shampoo, rinse your hair and body thoroughly to remove the                      Shampoo.  4.  Use CHG as you would any other liquid soap.  You can apply chg directly       to the skin and wash gently with scrungie or a clean washcloth.  5.  Apply the CHG Soap to your body ONLY FROM THE NECK DOWN.        Do not use on open wounds or open sores.  Avoid contact with your eyes,       ears, mouth and genitals (private parts).  Wash genitals (private parts)       with your normal soap.  6.  Wash thoroughly, paying special attention to  the area where your surgery        will be performed.  7.  Thoroughly rinse your body with warm water from the neck down.  8.  DO NOT shower/wash with your normal soap after using and rinsing off       the CHG Soap.  9.  Pat yourself dry with a clean towel.            10.  Wear clean pajamas.            11.  Place clean sheets on your bed the night of your first shower and do not        sleep with pets.  Day of Surgery  Do not apply any lotions/deoderants the morning of surgery.  Please wear clean clothes to the hospital/surgery center.     Please read over the following fact sheets that you were given: Pain Booklet, Coughing and Deep Breathing and Surgical Site Infection Prevention

## 2014-03-03 NOTE — Progress Notes (Signed)
Called pt to inform him of surgery time change and instructed him to be here at 5:30 AM. Pt voiced understanding.

## 2014-03-04 ENCOUNTER — Encounter (HOSPITAL_COMMUNITY): Payer: Self-pay | Admitting: Critical Care Medicine

## 2014-03-04 ENCOUNTER — Observation Stay (HOSPITAL_COMMUNITY)
Admission: RE | Admit: 2014-03-04 | Discharge: 2014-03-07 | Disposition: A | Discharge status: Home / Self Care | Payer: BLUE CROSS/BLUE SHIELD | Source: Ambulatory Visit | Attending: Orthopedic Surgery | Admitting: Orthopedic Surgery

## 2014-03-04 ENCOUNTER — Ambulatory Visit (HOSPITAL_COMMUNITY): Payer: BLUE CROSS/BLUE SHIELD | Admitting: Critical Care Medicine

## 2014-03-04 ENCOUNTER — Encounter (HOSPITAL_COMMUNITY)
Admission: RE | Disposition: A | Discharge status: Home / Self Care | Payer: Self-pay | Source: Ambulatory Visit | Attending: Orthopedic Surgery

## 2014-03-04 DIAGNOSIS — Y9367 Activity, basketball: Secondary | ICD-10-CM | POA: Insufficient documentation

## 2014-03-04 DIAGNOSIS — S83519A Sprain of anterior cruciate ligament of unspecified knee, initial encounter: Secondary | ICD-10-CM | POA: Diagnosis present

## 2014-03-04 DIAGNOSIS — Y92310 Basketball court as the place of occurrence of the external cause: Secondary | ICD-10-CM | POA: Insufficient documentation

## 2014-03-04 DIAGNOSIS — Z87442 Personal history of urinary calculi: Secondary | ICD-10-CM | POA: Insufficient documentation

## 2014-03-04 DIAGNOSIS — S83512A Sprain of anterior cruciate ligament of left knee, initial encounter: Secondary | ICD-10-CM | POA: Diagnosis not present

## 2014-03-04 DIAGNOSIS — F17210 Nicotine dependence, cigarettes, uncomplicated: Secondary | ICD-10-CM | POA: Insufficient documentation

## 2014-03-04 DIAGNOSIS — S83242A Other tear of medial meniscus, current injury, left knee, initial encounter: Secondary | ICD-10-CM | POA: Insufficient documentation

## 2014-03-04 HISTORY — PX: ANTERIOR CRUCIATE LIGAMENT REPAIR: SHX115

## 2014-03-04 SURGERY — RECONSTRUCTION, KNEE, ACL, USING HAMSTRING GRAFT
Anesthesia: Regional | Site: Knee | Laterality: Left

## 2014-03-04 MED ORDER — METHOCARBAMOL 500 MG PO TABS
500.0000 mg | ORAL_TABLET | Freq: Four times a day (QID) | ORAL | Status: DC | PRN
  Administered 2014-03-04 – 2014-03-07 (×11): 500 mg via ORAL
  Filled 2014-03-04 (×12): qty 1

## 2014-03-04 MED ORDER — HYDROMORPHONE HCL 1 MG/ML IJ SOLN
0.2500 mg | INTRAMUSCULAR | Status: DC | PRN
  Administered 2014-03-04 (×4): 0.5 mg via INTRAVENOUS

## 2014-03-04 MED ORDER — SODIUM CHLORIDE 0.9 % IJ SOLN
INTRAMUSCULAR | Status: DC | PRN
  Administered 2014-03-04: 30 mL via INTRAVENOUS

## 2014-03-04 MED ORDER — METHOCARBAMOL 500 MG PO TABS
ORAL_TABLET | ORAL | Status: AC
  Administered 2014-03-04: 500 mg via ORAL
  Filled 2014-03-04: qty 1

## 2014-03-04 MED ORDER — CLONIDINE HCL (ANALGESIA) 100 MCG/ML EP SOLN
150.0000 ug | EPIDURAL | Status: DC
  Filled 2014-03-04: qty 1.5

## 2014-03-04 MED ORDER — HYDROMORPHONE HCL 1 MG/ML IJ SOLN
INTRAMUSCULAR | Status: AC
  Administered 2014-03-04: 0.5 mg via INTRAVENOUS
  Filled 2014-03-04: qty 1

## 2014-03-04 MED ORDER — OXYCODONE HCL 5 MG PO TABS
5.0000 mg | ORAL_TABLET | ORAL | Status: DC | PRN
  Administered 2014-03-04 – 2014-03-07 (×17): 10 mg via ORAL
  Filled 2014-03-04 (×18): qty 2

## 2014-03-04 MED ORDER — EPHEDRINE SULFATE 50 MG/ML IJ SOLN
INTRAMUSCULAR | Status: AC
  Filled 2014-03-04: qty 1

## 2014-03-04 MED ORDER — SUCCINYLCHOLINE CHLORIDE 20 MG/ML IJ SOLN
INTRAMUSCULAR | Status: AC
  Filled 2014-03-04: qty 1

## 2014-03-04 MED ORDER — LIDOCAINE HCL (CARDIAC) 20 MG/ML IV SOLN
INTRAVENOUS | Status: AC
  Filled 2014-03-04: qty 5

## 2014-03-04 MED ORDER — PROPOFOL 10 MG/ML IV BOLUS
INTRAVENOUS | Status: AC
  Filled 2014-03-04: qty 20

## 2014-03-04 MED ORDER — METHOCARBAMOL 1000 MG/10ML IJ SOLN
500.0000 mg | Freq: Four times a day (QID) | INTRAVENOUS | Status: DC | PRN
  Filled 2014-03-04: qty 5

## 2014-03-04 MED ORDER — PROPOFOL 10 MG/ML IV BOLUS
INTRAVENOUS | Status: DC | PRN
Start: 2014-03-04 — End: 2014-03-04
  Administered 2014-03-04: 200 mg via INTRAVENOUS

## 2014-03-04 MED ORDER — FENTANYL CITRATE 0.05 MG/ML IJ SOLN
INTRAMUSCULAR | Status: DC | PRN
  Administered 2014-03-04 (×3): 25 ug via INTRAVENOUS
  Administered 2014-03-04 (×2): 50 ug via INTRAVENOUS
  Administered 2014-03-04 (×6): 25 ug via INTRAVENOUS

## 2014-03-04 MED ORDER — LIDOCAINE HCL (CARDIAC) 20 MG/ML IV SOLN
INTRAVENOUS | Status: DC | PRN
  Administered 2014-03-04: 40 mg via INTRAVENOUS

## 2014-03-04 MED ORDER — METOCLOPRAMIDE HCL 5 MG/ML IJ SOLN: 5.0000 mg | Freq: Three times a day (TID) | INTRAMUSCULAR | Status: DC | PRN

## 2014-03-04 MED ORDER — EPINEPHRINE HCL 1 MG/ML IJ SOLN
INTRAMUSCULAR | Status: DC | PRN
  Administered 2014-03-04: 1 mg
  Administered 2014-03-04: .1 mg
  Administered 2014-03-04: 1 mg

## 2014-03-04 MED ORDER — SODIUM CHLORIDE 0.9 % IJ SOLN
INTRAMUSCULAR | Status: AC
  Filled 2014-03-04: qty 10

## 2014-03-04 MED ORDER — OXYCODONE HCL 5 MG PO TABS
ORAL_TABLET | ORAL | Status: AC
  Administered 2014-03-04: 10 mg via ORAL
  Filled 2014-03-04: qty 2

## 2014-03-04 MED ORDER — FENTANYL CITRATE 0.05 MG/ML IJ SOLN
INTRAMUSCULAR | Status: AC
  Filled 2014-03-04: qty 5

## 2014-03-04 MED ORDER — CEFAZOLIN SODIUM-DEXTROSE 2-3 GM-% IV SOLR
2.0000 g | Freq: Three times a day (TID) | INTRAVENOUS | Status: AC
  Administered 2014-03-04 – 2014-03-05 (×2): 2 g via INTRAVENOUS
  Filled 2014-03-04 (×2): qty 50

## 2014-03-04 MED ORDER — BUPIVACAINE HCL (PF) 0.25 % IJ SOLN
INTRAMUSCULAR | Status: DC | PRN
Start: 2014-03-04 — End: 2014-03-04
  Administered 2014-03-04: 10 mL

## 2014-03-04 MED ORDER — LACTATED RINGERS IV SOLN
INTRAVENOUS | Status: DC | PRN
  Administered 2014-03-04 (×2): via INTRAVENOUS

## 2014-03-04 MED ORDER — DEXAMETHASONE SODIUM PHOSPHATE 4 MG/ML IJ SOLN
INTRAMUSCULAR | Status: DC | PRN
  Administered 2014-03-04: 4 mg via INTRAVENOUS

## 2014-03-04 MED ORDER — BUPIVACAINE HCL (PF) 0.25 % IJ SOLN
INTRAMUSCULAR | Status: AC
  Filled 2014-03-04: qty 30

## 2014-03-04 MED ORDER — MIDAZOLAM HCL 5 MG/5ML IJ SOLN
INTRAMUSCULAR | Status: DC | PRN
  Administered 2014-03-04: 2 mg via INTRAVENOUS

## 2014-03-04 MED ORDER — MIDAZOLAM HCL 2 MG/2ML IJ SOLN
INTRAMUSCULAR | Status: AC
  Filled 2014-03-04: qty 2

## 2014-03-04 MED ORDER — RIVAROXABAN 10 MG PO TABS
10.0000 mg | ORAL_TABLET | Freq: Every day | ORAL | Status: DC
Start: 2014-03-04 — End: 2014-03-07
  Administered 2014-03-04 – 2014-03-07 (×4): 10 mg via ORAL
  Filled 2014-03-04 (×6): qty 1

## 2014-03-04 MED ORDER — ONDANSETRON HCL 4 MG/2ML IJ SOLN
4.0000 mg | Freq: Four times a day (QID) | INTRAMUSCULAR | Status: DC | PRN
  Administered 2014-03-04 – 2014-03-05 (×2): 4 mg via INTRAVENOUS
  Filled 2014-03-04 (×2): qty 2

## 2014-03-04 MED ORDER — MORPHINE SULFATE 4 MG/ML IJ SOLN
INTRAMUSCULAR | Status: DC | PRN
  Administered 2014-03-04: 8 mg via INTRAVENOUS

## 2014-03-04 MED ORDER — 0.9 % SODIUM CHLORIDE (POUR BTL) OPTIME
TOPICAL | Status: DC | PRN
  Administered 2014-03-04: 1000 mL

## 2014-03-04 MED ORDER — SODIUM CHLORIDE 0.9 % IR SOLN
Status: DC | PRN
  Administered 2014-03-04 (×3): 3000 mL
  Administered 2014-03-04: 1000 mL
  Administered 2014-03-04 (×5): 3000 mL

## 2014-03-04 MED ORDER — MORPHINE SULFATE 4 MG/ML IJ SOLN
INTRAMUSCULAR | Status: AC
  Filled 2014-03-04: qty 1

## 2014-03-04 MED ORDER — ROCURONIUM BROMIDE 50 MG/5ML IV SOLN
INTRAVENOUS | Status: AC
  Filled 2014-03-04: qty 1

## 2014-03-04 MED ORDER — MORPHINE SULFATE 2 MG/ML IJ SOLN
1.0000 mg | INTRAMUSCULAR | Status: DC | PRN
  Administered 2014-03-05 (×3): 1 mg via INTRAVENOUS
  Filled 2014-03-04 (×3): qty 1

## 2014-03-04 MED ORDER — DEXAMETHASONE SODIUM PHOSPHATE 4 MG/ML IJ SOLN
INTRAMUSCULAR | Status: AC
  Filled 2014-03-04: qty 1

## 2014-03-04 MED ORDER — EPINEPHRINE HCL 1 MG/ML IJ SOLN
INTRAMUSCULAR | Status: AC
  Filled 2014-03-04: qty 3

## 2014-03-04 MED ORDER — ONDANSETRON HCL 4 MG/2ML IJ SOLN
INTRAMUSCULAR | Status: DC | PRN
  Administered 2014-03-04: 4 mg via INTRAVENOUS

## 2014-03-04 MED ORDER — METOCLOPRAMIDE HCL 5 MG PO TABS
5.0000 mg | ORAL_TABLET | Freq: Three times a day (TID) | ORAL | Status: DC | PRN
Start: 2014-03-04 — End: 2014-03-07
  Filled 2014-03-04: qty 2

## 2014-03-04 MED ORDER — ONDANSETRON HCL 4 MG PO TABS: 4.0000 mg | ORAL_TABLET | Freq: Four times a day (QID) | ORAL | Status: DC | PRN

## 2014-03-04 MED ORDER — CLONIDINE HCL (ANALGESIA) 100 MCG/ML EP SOLN
EPIDURAL | Status: DC | PRN
  Administered 2014-03-04: 1 mL

## 2014-03-04 MED ORDER — POTASSIUM CHLORIDE IN NACL 20-0.9 MEQ/L-% IV SOLN
INTRAVENOUS | Status: AC
  Administered 2014-03-04: 23:00:00 via INTRAVENOUS
  Filled 2014-03-04 (×2): qty 1000

## 2014-03-04 SURGICAL SUPPLY — 92 items
ANCHOR BUTTON TIGHTROPE ACL RT (Orthopedic Implant) ×4 IMPLANT
BANDAGE ESMARK 6X9 LF (GAUZE/BANDAGES/DRESSINGS) IMPLANT
BLADE CUDA 5.5 (BLADE) ×2 IMPLANT
BLADE CUTTER GATOR 3.5 (BLADE) ×6 IMPLANT
BLADE CUTTER MENIS 5.5 (BLADE) ×2 IMPLANT
BLADE GREAT WHITE 4.2 (BLADE) ×2 IMPLANT
BLADE SURG 10 STRL SS (BLADE) ×2 IMPLANT
BLADE SURG 15 STRL LF DISP TIS (BLADE) ×2 IMPLANT
BLADE SURG 15 STRL SS (BLADE) ×2
BNDG ELASTIC 6X15 VLCR STRL LF (GAUZE/BANDAGES/DRESSINGS) ×2 IMPLANT
BNDG ESMARK 6X9 LF (GAUZE/BANDAGES/DRESSINGS)
BONE MATRIX DEMINERALIZED 1CC (Bone Implant) ×4 IMPLANT
BUR OVAL 6.0 (BURR) ×2 IMPLANT
CLSR STERI-STRIP ANTIMIC 1/2X4 (GAUZE/BANDAGES/DRESSINGS) ×2 IMPLANT
COVER SURGICAL LIGHT HANDLE (MISCELLANEOUS) ×2 IMPLANT
CUFF TOURNIQUET SINGLE 34IN LL (TOURNIQUET CUFF) ×2 IMPLANT
DECANTER SPIKE VIAL GLASS SM (MISCELLANEOUS) ×2 IMPLANT
DRAPE ARTHROSCOPY W/POUCH 114 (DRAPES) ×2 IMPLANT
DRAPE INCISE IOBAN 66X45 STRL (DRAPES) ×2 IMPLANT
DRAPE U-SHAPE 47X51 STRL (DRAPES) ×2 IMPLANT
DRILL FLIPCUTTER II 10MM (CUTTER) ×1 IMPLANT
DRILL FLIPCUTTER II 9.0MM (INSTRUMENTS) ×1 IMPLANT
DRSG PAD ABDOMINAL 8X10 ST (GAUZE/BANDAGES/DRESSINGS) ×2 IMPLANT
DURAPREP 26ML APPLICATOR (WOUND CARE) ×4 IMPLANT
ELECT REM PT RETURN 9FT ADLT (ELECTROSURGICAL) ×2
ELECTRODE REM PT RTRN 9FT ADLT (ELECTROSURGICAL) ×1 IMPLANT
FLIPCUTTER II   10MM ×2 IMPLANT
FLIPCUTTER II 10MM (CUTTER) ×2
FLIPCUTTER II 9.0MM (INSTRUMENTS) ×2
GAUZE SPONGE 4X4 12PLY STRL (GAUZE/BANDAGES/DRESSINGS) ×4 IMPLANT
GAUZE XEROFORM 1X8 LF (GAUZE/BANDAGES/DRESSINGS) ×2 IMPLANT
GLOVE BIO SURGEON STRL SZ7 (GLOVE) ×2 IMPLANT
GLOVE BIOGEL PI IND STRL 6.5 (GLOVE) ×2 IMPLANT
GLOVE BIOGEL PI IND STRL 7.5 (GLOVE) ×1 IMPLANT
GLOVE BIOGEL PI IND STRL 8 (GLOVE) ×1 IMPLANT
GLOVE BIOGEL PI INDICATOR 6.5 (GLOVE) ×2
GLOVE BIOGEL PI INDICATOR 7.5 (GLOVE) ×1
GLOVE BIOGEL PI INDICATOR 8 (GLOVE) ×1
GLOVE BIOGEL PI ORTHO PRO SZ7 (GLOVE) ×1
GLOVE ECLIPSE 7.0 STRL STRAW (GLOVE) ×4 IMPLANT
GLOVE ECLIPSE 8.0 STRL XLNG CF (GLOVE) ×2 IMPLANT
GLOVE ORTHO TXT STRL SZ7.5 (GLOVE) ×2 IMPLANT
GLOVE PI ORTHO PRO STRL SZ7 (GLOVE) ×1 IMPLANT
GLOVE SURG ORTHO 8.0 STRL STRW (GLOVE) ×2 IMPLANT
GOWN BRE IMP PREV XXLGXLNG (GOWN DISPOSABLE) ×2 IMPLANT
GOWN SPEC L3 XXLG W/TWL (GOWN DISPOSABLE) ×2 IMPLANT
GOWN STRL REUS W/ TWL LRG LVL3 (GOWN DISPOSABLE) ×3 IMPLANT
GOWN STRL REUS W/TWL LRG LVL3 (GOWN DISPOSABLE) ×3
IMMOBILIZER KNEE 22 UNIV (SOFTGOODS) ×2 IMPLANT
KIT BASIN OR (CUSTOM PROCEDURE TRAY) ×2 IMPLANT
KIT BIOCARTILAGE DEL W/SYRINGE (KITS) ×2 IMPLANT
KIT ROOM TURNOVER OR (KITS) ×2 IMPLANT
MANIFOLD NEPTUNE II (INSTRUMENTS) ×2 IMPLANT
NEEDLE 18GX1X1/2 (RX/OR ONLY) (NEEDLE) ×2 IMPLANT
NS IRRIG 1000ML POUR BTL (IV SOLUTION) IMPLANT
PACK ARTHROSCOPY DSU (CUSTOM PROCEDURE TRAY) ×2 IMPLANT
PAD ARMBOARD 7.5X6 YLW CONV (MISCELLANEOUS) ×2 IMPLANT
PAD CAST 4YDX4 CTTN HI CHSV (CAST SUPPLIES) ×1 IMPLANT
PADDING CAST COTTON 4X4 STRL (CAST SUPPLIES) ×1
PADDING CAST COTTON 6X4 STRL (CAST SUPPLIES) ×2 IMPLANT
PENCIL BUTTON HOLSTER BLD 10FT (ELECTRODE) ×2 IMPLANT
SET ARTHROSCOPY TUBING (MISCELLANEOUS) ×1
SET ARTHROSCOPY TUBING LN (MISCELLANEOUS) ×1 IMPLANT
SPONGE GAUZE 4X4 12PLY STER LF (GAUZE/BANDAGES/DRESSINGS) ×2 IMPLANT
SPONGE LAP 4X18 X RAY DECT (DISPOSABLE) ×6 IMPLANT
SPONGE SCRUB IODOPHOR (GAUZE/BANDAGES/DRESSINGS) IMPLANT
STRIP CLOSURE SKIN 1/2X4 (GAUZE/BANDAGES/DRESSINGS) ×2 IMPLANT
SUCTION FRAZIER TIP 10 FR DISP (SUCTIONS) ×2 IMPLANT
SUT 2 FIBERLOOP 20 STRT BLUE (SUTURE) ×4
SUT ETHILON 3 0 PS 1 (SUTURE) ×6 IMPLANT
SUT FIBERWIRE #2 38 T-5 BLUE (SUTURE) ×2
SUT FIBERWIRE 2-0 18 17.9 3/8 (SUTURE) ×6
SUT MENISCAL KIT (KITS) ×2 IMPLANT
SUT PROLENE 3 0 PS 2 (SUTURE) ×4 IMPLANT
SUT VIC AB 0 CT1 27 (SUTURE) ×2
SUT VIC AB 0 CT1 27XBRD ANBCTR (SUTURE) ×2 IMPLANT
SUT VIC AB 2-0 CT1 27 (SUTURE) ×3
SUT VIC AB 2-0 CT1 TAPERPNT 27 (SUTURE) ×3 IMPLANT
SUT VIC AB 3-0 FS2 27 (SUTURE) ×4 IMPLANT
SUTURE 2 FIBERLOOP 20 STRT BLU (SUTURE) ×2 IMPLANT
SUTURE FIBERWR #2 38 T-5 BLUE (SUTURE) ×1 IMPLANT
SUTURE FIBERWR 2-0 18 17.9 3/8 (SUTURE) ×3 IMPLANT
SUTURE TIGERSTICK 2 TIGERWIR 2 (MISCELLANEOUS) ×2 IMPLANT
SYR 30ML LL (SYRINGE) ×2 IMPLANT
SYR BULB IRRIGATION 50ML (SYRINGE) ×2 IMPLANT
SYR TB 1ML LUER SLIP (SYRINGE) ×6 IMPLANT
TIGERSTICK 2 TIGERWIRE 2 (MISCELLANEOUS) ×4
TOWEL OR 17X24 6PK STRL BLUE (TOWEL DISPOSABLE) ×2 IMPLANT
TOWEL OR 17X26 10 PK STRL BLUE (TOWEL DISPOSABLE) ×2 IMPLANT
UNDERPAD 30X30 INCONTINENT (UNDERPADS AND DIAPERS) ×2 IMPLANT
WAND HAND CNTRL MULTIVAC 90 (MISCELLANEOUS) ×2 IMPLANT
WATER STERILE IRR 1000ML POUR (IV SOLUTION) ×2 IMPLANT

## 2014-03-04 NOTE — Anesthesia Postprocedure Evaluation (Signed)
  Anesthesia Post-op Note  Patient: Brian Juarez  Procedure(s) Performed: Procedure(s): RECONSTRUCTION ANTERIOR CRUCIATE LIGAMENT (ACL) WITH HAMSTRING GRAFT, MEDIAL MENISCAL REPAIR. (Left)  Patient Location: PACU  Anesthesia Type:General  Level of Consciousness: awake, alert  and oriented  Airway and Oxygen Therapy: Patient Spontanous Breathing and Patient connected to nasal cannula oxygen  Post-op Pain: mild  Post-op Assessment: Post-op Vital signs reviewed, Patient's Cardiovascular Status Stable, Respiratory Function Stable, Patent Airway, No signs of Nausea or vomiting and Pain level controlled  Post-op Vital Signs: stable  Last Vitals:  Filed Vitals:   03/04/14 1330  BP: 157/70  Pulse: 76  Temp:   Resp: 16    Complications: No apparent anesthesia complications

## 2014-03-04 NOTE — Progress Notes (Signed)
Orthopedic Tech Progress Note Patient Details:  Brian Juarez September 04, 1990 [id number]  CPM Left Knee CPM Left Knee: On Left Knee Flexion (Degrees): 45 Left Knee Extension (Degrees): 0   Brian Juarez, Brian Juarez 03/04/2014, 8:38 PM

## 2014-03-04 NOTE — Progress Notes (Signed)
RN spoke with the Ortho Tech and made him aware that the CPM was ordered to start tonight at 19:00.

## 2014-03-04 NOTE — Anesthesia Procedure Notes (Addendum)
Procedure Name: LMA Insertion Date/Time: 03/04/2014 7:53 AM Performed by: Elon AlasLEE, HEATHER BROWN Pre-anesthesia Checklist: Patient identified, Emergency Drugs available, Suction available, Patient being monitored and Timeout performed Patient Re-evaluated:Patient Re-evaluated prior to inductionOxygen Delivery Method: Circle system utilized Preoxygenation: Pre-oxygenation with 100% oxygen Intubation Type: IV induction Ventilation: Mask ventilation without difficulty LMA: LMA inserted LMA Size: 5.0 Number of attempts: 1 Placement Confirmation: positive ETCO2 and breath sounds checked- equal and bilateral Tube secured with: Tape Dental Injury: Teeth and Oropharynx as per pre-operative assessment    Anesthesia Regional Block:  Adductor canal block  Pre-Anesthetic Checklist: ,, timeout performed, Correct Patient, Correct Site, Correct Laterality, Correct Procedure, Correct Position, site marked, Risks and benefits discussed,  Surgical consent,  Pre-op evaluation,  At surgeon's request and post-op pain management  Laterality: Left  Prep: chloraprep       Needles:  Injection technique: Single-shot  Needle Type: Echogenic Stimulator Needle     Needle Length: 9cm 9 cm Needle Gauge: 22 and 22 G    Additional Needles: Adductor canal block Narrative:  Start time: 03/04/2014 12:00 PM End time: 03/04/2014 12:05 PM Injection made incrementally with aspirations every 5 mL.  Additional Notes: 30 cc 0.5% marcaine with 1:200 Epi injected easily

## 2014-03-04 NOTE — Anesthesia Preprocedure Evaluation (Addendum)
Anesthesia Evaluation  Patient identified by MRN, date of birth, ID band Patient awake    Reviewed: Allergy & Precautions, NPO status , Patient's Chart, lab work & pertinent test results  Airway Mallampati: II  TM Distance: >3 FB Neck ROM: Full    Dental  (+) Dental Advisory Given, Teeth Intact   Pulmonary Current Smoker,  breath sounds clear to auscultation        Cardiovascular Rhythm:Regular Rate:Normal     Neuro/Psych  Headaches,    GI/Hepatic   Endo/Other    Renal/GU      Musculoskeletal   Abdominal   Peds  Hematology   Anesthesia Other Findings   Reproductive/Obstetrics                            Anesthesia Physical Anesthesia Plan  ASA: I  Anesthesia Plan: General and Regional   Post-op Pain Management:    Induction: Intravenous  Airway Management Planned: LMA  Additional Equipment:   Intra-op Plan:   Post-operative Plan: Extubation in OR  Informed Consent: I have reviewed the patients History and Physical, chart, labs and discussed the procedure including the risks, benefits and alternatives for the proposed anesthesia with the patient or authorized representative who has indicated his/her understanding and acceptance.   Dental advisory given  Plan Discussed with: CRNA  Anesthesia Plan Comments:         Anesthesia Quick Evaluation

## 2014-03-04 NOTE — Progress Notes (Signed)
ANTICOAGULATION CONSULT NOTE - Initial Consult  Pharmacy Consult for Xarelto Indication: VTE prophylaxis post-op  No Known Allergies  Patient Measurements: Weight: 204 lb (92.534 kg)  Vital Signs: Temp: 98 F (36.7 C) (01/05 1430) Temp Source: Oral (01/05 0608) BP: 113/63 mmHg (01/05 1715) Pulse Rate: 89 (01/05 1715)  Labs:  Recent Labs  03/03/14 1043  HGB 15.6  HCT 46.8  PLT 202    Estimated Creatinine Clearance: 151.4 mL/min (by C-G formula based on Cr of 1.01).   Medical History: Past Medical History  Diagnosis Date  . Knee pain   . Headache     occasionally  . History of kidney stones     Medications:  Scheduled:  .  ceFAZolin (ANCEF) IV  2 g Intravenous 3 times per day    Assessment: 24 yo M s/p repair of left knee ACL tear, medial meniscal tear.  Pt has a family hx of Factor V Leiden (mother, grandfather) but his status is unknown.  To begin Xarelto for post-op VTE prophylaxis.  Goal of Therapy:  Monitor platelets by anticoagulation protocol: Yes   Plan:  Xarelto 10mg  PO daily - first dose tonight. Xarelto education  Toys 'R' UsKimberly Blannie Shedlock, 1700 Rainbow BoulevardPharm.D., BCPS Clinical Pharmacist Pager [phone] 03/04/2014 6:09 PM

## 2014-03-04 NOTE — Brief Op Note (Signed)
03/04/2014  12:30 PM  PATIENT:  Brian Juarez  24 y.o. male  PRE-OPERATIVE DIAGNOSIS:  LEFT KNEE ACL TEAR, MEDIAL MENISCAL TEAR  POST-OPERATIVE DIAGNOSIS:  LEFT KNEE ACL TEAR, MEDIAL MENISCAL TEAR  PROCEDURE:  Procedure(s): RECONSTRUCTION ANTERIOR CRUCIATE LIGAMENT (ACL) WITH HAMSTRING GRAFT, MEDIAL MENISCAL REPAIR.  SURGEON:  Surgeon(s): Cammy CopaGregory Scott Yaret Hush, MD  ASSISTANT: Clarnce Flockj owens pa  ANESTHESIA:   local, regional and general  EBL: 25 ml    Total I/O In: 1500 [I.V.:1500] Out: 25 [Blood:25]  BLOOD ADMINISTERED: none  DRAINS: none   LOCAL MEDICATIONS USED:  Marcaine mso4 clonidine  SPECIMEN:  No Specimen  COUNTS:  YES  TOURNIQUET:    DICTATION: .Other Dictation: Dictation Number [id number]) [id number]  PLAN OF CARE: Admit for overnight observation  PATIENT DISPOSITION:  PACU - hemodynamically stable

## 2014-03-04 NOTE — Transfer of Care (Signed)
Immediate Anesthesia Transfer of Care Note  Patient: Brian Juarez  Procedure(s) Performed: Procedure(s): RECONSTRUCTION ANTERIOR CRUCIATE LIGAMENT (ACL) WITH HAMSTRING GRAFT, MEDIAL MENISCAL REPAIR. (Left)  Patient Location: PACU  Anesthesia Type:General  Level of Consciousness: sedated  Airway & Oxygen Therapy: Patient Spontanous Breathing and Patient connected to nasal cannula oxygen  Post-op Assessment: Report given to PACU RN, Post -op Vital signs reviewed and stable and Patient moving all extremities X 4  Post vital signs: Reviewed and stable  Complications: No apparent anesthesia complications

## 2014-03-04 NOTE — H&P (Signed)
Brian Juarez is an 24 y.o. male.   Chief Complaint: Left knee pain and instability HPI: Brian Juarez is a 24 year old patient with left knee pain and instability. He sustained an injury to his knee several months ago. MRI scanning did show anterior cruciate ligament tear and medial meniscal tear which is a significantly large tear. He's had some symptoms of symptomatic instability since that time we discussed the case with him in the clinic did say that his maternal grandfather did have clotting issues. This morning when talking with his mother she says that she does have factor V hypercoagulability issues. The patient himself has never had any issues with blood clotting. He does want to resume playing basketball.  Past Medical History  Diagnosis Date  . Knee pain   . Headache     occasionally  . History of kidney stones     Past Surgical History  Procedure Laterality Date  . No past surgeries      Family History  Problem Relation Age of Onset  . Stroke Mother   . Hypertension Mother   . Heart disease Father   . Hyperlipidemia Father   . Hypertension Father   . Stroke Father   . Diabetes Maternal Grandmother   . Heart disease Maternal Grandmother   . Hyperlipidemia Maternal Grandmother   . Hypertension Maternal Grandmother   . Stroke Maternal Grandmother   . Cancer Maternal Grandfather   . Hyperlipidemia Maternal Grandfather   . Diabetes Paternal Grandmother   . Hyperlipidemia Paternal Grandmother   . Stroke Paternal Grandmother   . Hyperlipidemia Paternal Grandfather   . Hypertension Paternal Grandfather    Social History:  reports that he has been smoking.  He has never used smokeless tobacco. He reports that he drinks alcohol. He reports that he does not use illicit drugs.  Allergies: No Known Allergies  Medications Prior to Admission  Medication Sig Dispense Refill  . ibuprofen (ADVIL,MOTRIN) 200 MG tablet Take 400-600 mg by mouth daily as needed for headache (pain).     . carbamide peroxide (DEBROX) 6.5 % otic solution Place 5 drops into both ears 2 (two) times daily. (Patient not taking: Reported on 02/25/2014) 15 mL 0  . naproxen (NAPROSYN) 500 MG tablet Take 1 tablet (500 mg total) by mouth 2 (two) times daily. (Patient not taking: Reported on 02/25/2014) 30 tablet 0  . tobramycin (TOBREX) 0.3 % ophthalmic solution Place 1 drop into the left eye every 6 (six) hours. (Patient not taking: Reported on 02/25/2014) 5 mL 0    Results for orders placed or performed during the hospital encounter of 03/03/14 (from the past 48 hour(s))  CBC     Status: None   Collection Time: 03/03/14 10:43 AM  Result Value Ref Range   WBC 5.1 4.0 - 10.5 K/uL   RBC 5.38 4.22 - 5.81 MIL/uL   Hemoglobin 15.6 13.0 - 17.0 g/dL   HCT 19.1 47.8 - 29.5 %   MCV 87.0 78.0 - 100.0 fL   MCH 29.0 26.0 - 34.0 pg   MCHC 33.3 30.0 - 36.0 g/dL   RDW 62.1 30.8 - 65.7 %   Platelets 202 150 - 400 K/uL   No results found.  Review of Systems  Constitutional: Negative.   HENT: Negative.   Eyes: Negative.   Respiratory: Negative.   Cardiovascular: Negative.   Gastrointestinal: Negative.   Genitourinary: Negative.   Musculoskeletal: Positive for joint pain.  Skin: Negative.   Neurological: Negative.   Endo/Heme/Allergies: Negative.  Psychiatric/Behavioral: Negative.     Blood pressure 144/90, pulse 65, temperature 98.1 F (36.7 C), temperature source Oral, resp. rate 20, weight 92.534 kg (204 lb), SpO2 100 %. Physical Exam  Constitutional: He appears well-developed.  HENT:  Head: Normocephalic.  Eyes: Pupils are equal, round, and reactive to light.  Neck: Normal range of motion.  Cardiovascular: Normal rate.   Respiratory: Effort normal.  Neurological: He is alert.  Skin: Skin is warm.  Psychiatric: He has a normal mood and affect.   examination of the left knee demonstrates full range of motion unstable anterior cruciate ligament medial joint line tenderness palpable pedal  pulses intact extensor mechanism  Assessment/Plan Impressions left knee anterior cruciate ligament tear and medial meniscal tear plan anterior cruciate ligament reconstruction hamstring autograft and medial meniscal repair risks and benefits discussed with the patient including not limited to infection and vessel damage knee stiffness numbness as well as failure meniscal repair questions answered we'll plan on beginning xarelto also postop for DVT prophylaxis  Clare Fennimore SCOTT 03/04/2014, 7:30 AM

## 2014-03-05 DIAGNOSIS — S83512A Sprain of anterior cruciate ligament of left knee, initial encounter: Secondary | ICD-10-CM | POA: Diagnosis not present

## 2014-03-05 MED ORDER — METHOCARBAMOL 500 MG PO TABS: 500.0000 mg | ORAL_TABLET | Freq: Four times a day (QID) | ORAL | Status: DC | PRN

## 2014-03-05 MED ORDER — OXYCODONE HCL ER 10 MG PO T12A
10.0000 mg | EXTENDED_RELEASE_TABLET | Freq: Two times a day (BID) | ORAL | Status: DC
  Administered 2014-03-05 – 2014-03-07 (×5): 10 mg via ORAL
  Filled 2014-03-05 (×5): qty 1

## 2014-03-05 MED ORDER — OXYCODONE HCL 5 MG PO TABS: 5.0000 mg | ORAL_TABLET | ORAL | Status: DC | PRN

## 2014-03-05 MED ORDER — RIVAROXABAN 10 MG PO TABS: 10.0000 mg | ORAL_TABLET | Freq: Every day | ORAL | Status: DC

## 2014-03-05 NOTE — Progress Notes (Signed)
UR completed 

## 2014-03-05 NOTE — Progress Notes (Signed)
Orthopedic Tech Progress Note Patient Details:  Ala BentBrandon Bouffard 01-30-1991 [id number]  Ortho Devices Type of Ortho Device: Crutches Ortho Device/Splint Interventions: Application Viewed order from doctor's order list  Nikki DomCrawford, Zelta Enfield 03/05/2014, 11:34 AM

## 2014-03-05 NOTE — Plan of Care (Signed)
Problem: Phase II Progression Outcomes Goal: Pain controlled Outcome: Progressing Pain medication regimen established

## 2014-03-05 NOTE — Evaluation (Signed)
Physical Therapy Evaluation Patient Details Name: Brian Juarez MRN: [id number] DOB: March 14, 1990 Today's Date: 03/05/2014   History of Present Illness  HPI: Brian Juarez is a 24 year old patient with left knee pain and instability. He sustained an injury to his knee several months ago. MRI scanning did show anterior cruciate ligament tear and medial meniscal tear which is a significantly large tear. He's had some symptoms of symptomatic instability since that time we discussed the case with him in the clinic did say that his maternal grandfather did have clotting issues. This morning when talking with his mother she says that she does have factor V hypercoagulability issues. The patient himself has never had any issues with blood clotting. He does want to resume playing basketball. s/p ACL repair with hamstring autograft, and meniscal repair; Orthostatic BPs on eval   Clinical Impression   Patient is s/p above surgery resulting in functional limitations due to the deficits listed below (see PT Problem List).  Patient will benefit from skilled PT to increase their independence and safety with mobility to allow discharge to the venue listed below.       Follow Up Recommendations Outpatient PT  The potential need for Outpatient PT can be addressed at Ortho follow-up appointments.     Equipment Recommendations  Crutches;3in1 (PT) (delivered to room)    Recommendations for Other Services       Precautions / Restrictions Precautions Precautions: Fall Precaution Comments: watch for symptoms of orthostatic hypotension Required Braces or Orthoses: Knee Immobilizer - Left Knee Immobilizer - Left: On when out of bed or walking Restrictions LLE Weight Bearing: Weight bearing as tolerated      Mobility  Bed Mobility Overal bed mobility: Needs Assistance Bed Mobility: Supine to Sit     Supine to sit: Min assist     General bed mobility comments: Cues for technique; gave options fo rcontrolling  LLE coming off of the bed including using RLE to assist, or the handle of the crutch to control; Min assist for RLE, but smoother transition with seocnd time  Transfers Overall transfer level: Needs assistance Equipment used: Crutches Transfers: Sit to/from Stand Sit to Stand: Min assist;Min guard         General transfer comment: Cues for technique and crutch management; Min assist first time then minguard assist second time; REported dizziness with standing  Ambulation/Gait Ambulation/Gait assistance: Min guard Ambulation Distance (Feet):  (2 steps forward and backward) Assistive device: Crutches Gait Pattern/deviations: Step-to pattern     General Gait Details: Verbal and demo cues for technique; Limited amb due to significant dizziness in standing in conjunction with a drop in systolic BP greater than 20 mmHg from supine to standing  Stairs            Wheelchair Mobility    Modified Rankin (Stroke Patients Only)       Balance Overall balance assessment: Needs assistance           Standing balance-Leahy Scale: Poor Standing balance comment: due to orthostatic hypotension                             Pertinent Vitals/Pain Pain Assessment: 0-10 Pain Score: 9  Pain Location: L knee Pain Descriptors / Indicators: Aching;Sore (reports feeling pulling) Pain Intervention(s): Monitored during session;Premedicated before session;Repositioned    Home Living Family/patient expects to be discharged to:: Private residence Living Arrangements: Alone Available Help at Discharge: Family;Available 24 hours/day Type of Home: House Home  Access: Stairs to enter   [employer] of Steps: 1 Home Layout: One level Home Equipment: None      Prior Function Level of Independence: Independent               Hand Dominance        Extremity/Trunk Assessment   Upper Extremity Assessment: Overall WFL for tasks assessed           Lower  Extremity Assessment: LLE deficits/detail   LLE Deficits / Details: Grossly decr AROM and strength, limited by pain postop; quad activation present, though weak  Cervical / Trunk Assessment: Normal  Communication   Communication: No difficulties  Cognition Arousal/Alertness: Awake/alert Behavior During Therapy: WFL for tasks assessed/performed;Anxious Overall Cognitive Status: Within Functional Limits for tasks assessed                      General Comments      Exercises        Assessment/Plan    PT Assessment Patient needs continued PT services  PT Diagnosis Difficulty walking;Acute pain   PT Problem List Decreased strength;Decreased range of motion;Decreased activity tolerance;Decreased balance;Decreased mobility;Decreased knowledge of use of DME;Decreased knowledge of precautions;Pain  PT Treatment Interventions DME instruction;Gait training;Stair training;Functional mobility training;Therapeutic activities;Therapeutic exercise;Patient/family education   PT Goals (Current goals can be found in the Care Plan section) Acute Rehab PT Goals Patient Stated Goal: back to basketball PT Goal Formulation: With patient Time For Goal Achievement: 03/12/14 Potential to Achieve Goals: Good    Frequency Min 5X/week   Barriers to discharge        Co-evaluation               End of Session            Functional Assessment Tool Used: Clinical Judgement Functional Limitation: Mobility: Walking and moving around Mobility: Walking and Moving Around Current Status (Z6109): At least 20 percent but less than 40 percent impaired, limited or restricted Mobility: Walking and Moving Around Goal Status [phone]): 0 percent impaired, limited or restricted    Time: 1022-1148 (minus approx 10 minutes to check on crutches) PT Time Calculation (min) (ACUTE ONLY): 86 min   Charges:   PT Evaluation $Initial PT Evaluation Tier I: 1 Procedure PT Treatments $Gait Training: 8-22  mins $Therapeutic Activity: 53-67 mins   PT G Codes:   PT G-Codes **NOT FOR INPATIENT CLASS** Functional Assessment Tool Used: Clinical Judgement Functional Limitation: Mobility: Walking and moving around Mobility: Walking and Moving Around Current Status (U9811): At least 20 percent but less than 40 percent impaired, limited or restricted Mobility: Walking and Moving Around Goal Status [phone]): 0 percent impaired, limited or restricted    Van Clines Hamff 03/05/2014, 1:22 PM  Van Clines, PT  Acute Rehabilitation Services Pager [phone] Office [phone]

## 2014-03-05 NOTE — Progress Notes (Signed)
Orthopedic Tech Progress Note Patient Details:  Brian Juarez [birth year] [id number]  Ortho Devices Type of Ortho Device: Crutches Ortho Device/Splint Interventions: Application   Nikki DomCrawford, Durante Violett 03/05/2014, 12:29 PM

## 2014-03-05 NOTE — Progress Notes (Signed)
Patient stable Vital signs stable Left foot perfused mobile sensate Plan PT today leiden panel pending Dc today needs dressing change - Mepilex 2 incisions plus rewrap knee with Ace wrap CPM prior to discharge okay to weight-bear as tolerated in the immobilizer

## 2014-03-05 NOTE — Op Note (Signed)
NAMJennings Books:  Juarez, Brian Juarez             ACCOUNT NO.:  [id number]  MEDICAL RECORD NO.:  [id number]  LOCATION:  5N31C                        FACILITY:  [facility]  PHYSICIAN:  Burnard BuntingG. Scott Dean, M.D.    DATE OF BIRTH:  Mar 15, 1990  DATE OF PROCEDURE: DATE OF DISCHARGE:                              OPERATIVE REPORT   PREOPERATIVE DIAGNOSES:  Left knee anterior cruciate ligament tear, medial meniscal tear.  POSTOPERATIVE DIAGNOSES:  Left knee anterior cruciate ligament tear, medial meniscal tear.  PROCEDURE:  Left knee ACL reconstruction hamstring autograft.  Inside- out medial meniscal repair.  SURGEON:  Burnard BuntingG. Scott Dean, M.D.  ASSISTANT:  Genene ChurnJames M. Denton Meekwens, P.A.  ANESTHESIA:  General plus adductor canal block plus postop Marcaine, morphine, clonidine.  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  The patient also has a significant medial meniscal tear.  OPERATIVE FINDINGS: 1. Examination under anesthesia:  Range of motion 10 degrees of     hyperextension, full flexion, stability to varus-valgus stress at     0, 30, and 90 degrees.  ACL out, PCL intact.  No posterolateral     rotatory instability is noted.  1. Diagnostic and operative arthroscopy: 2. Fissure within the trochlea is about 5 mm with no loose chondral     flaps.  Undersurface of the patella intact. 3. No loose bodies in medial and lateral gutter. 4. Intact lateral compartment meniscus but with early fissuring of the     femoral cartilage. 5. Intact PCL with torn ACL. 6. Tear through the posterior horn medial meniscus over about 2 cm     involving about 80% anterior-posterior width of the meniscus in the     red-white zone of the meniscus itself. 7. Torn ACL.  PROCEDURE IN DETAIL:  The patient was brought to the operating room where general anesthetic was induced.  Preoperative  antibiotics administered.  Time-out was called.  Left leg was prescrubbed with alcohol and Betadine, allowed to air dry, prepped with DuraPrep solution and draped in sterile manner.  Collier Flowersoban was used to cover the operative field.  Leg holder was utilized.  Tourniquet was not utilized.  Incision made over the pes bursa tendon.  Semitendinosus was harvested care being taken to avoid injury to the saphenous nerve.  Skin and subcutaneous tissue were sharply divided.  The semi-T tendon was mobilized.  Fascial strip tied into the gastrocs were released with great care.  The tendon was then harvested and prepared on the back table to a size 10 in the femur, 9 in the tibia.  At this time, concurrently the anterior, inferior, lateral and anterior, inferior, medial portal established. Diagnostic arthroscopy was performed.  Fissuring of the cartilage was present on the patellofemoral joint particularly in the trochlea. General early fissuring without discrete arthritis is present on the lateral femoral condyle a little bit more where it was present on the medial femoral condyle.  Medial meniscal tear was visualized extending 2 cm from the meniscal root.  It is an unstable tear prepared with a meniscal rasp.  Notchplasty performed.  ACL stump debrided.  Incision  was then made on the posteromedial border of the joint line medially. Skin and subcutaneous tissue were sharply divided.  Care taken to avoid injury to the saphenous nerve.  The joint line was palpated after dividing to the sartorial fascia.  The Graves speculum was placed.  The inside-out sutures were then placed through a transpatellar portal with care being taken to avoid injury to the neurovascular structures.  Three vertical mattress sutures placed, 1 horizontal mattress in the undersurface of placement.  This was with 2-0 FiberWire.  The sutures were tied at the knee in about 20 degrees of flexion.  Good repair was achieved.  It is very  stable.  At this time, the femoral tunnel was drilled with a 10 mm FlipCutter at the 3 o'clock position.  Tibial tunnel drilled at 60 degrees.  Graft was passed with StimuBlast placed into the tunnels.  The graft was tightened with the patient in extension but not hyperextension.  Excellent stability of the knee was restored at this time.  Thorough irrigation performed in all of the incisions.  The harvested incision and meniscal repair incision closed using 0 Vicryl, 2- 0 Vicryl, and 3-0 Prolene.  Portal incisions were closed using 3-0 Vicryl and 3-0 nylon.  Solution of Marcaine, morphine, clonidine injected into the knee.  Bulky dressing and knee immobilizer were placed.  Sheran Luz assistance required for graft preparation and tunnel preparation.  His assistance was a medical necessity.     Burnard Bunting, M.D.     GSD/MEDQ  D:  03/04/2014  T:  03/05/2014  Job:  [id number]

## 2014-03-05 NOTE — Plan of Care (Signed)
Problem: Phase III Progression Outcomes Goal: Pain controlled on oral analgesia Outcome: Progressing IV medication given for breakthrough pain

## 2014-03-05 NOTE — Discharge Instructions (Signed)
Information on my medicine - XARELTO® (Rivaroxaban) ° °This medication education was reviewed with me or my healthcare representative as part of my discharge preparation.  The pharmacist that spoke with me during my hospital stay was:  Decarla Siemen P, RPH ° °Why was Xarelto® prescribed for you? °Xarelto® was prescribed for you to reduce the risk of blood clots forming after orthopedic surgery. The medical term for these abnormal blood clots is venous thromboembolism (VTE). ° °What do you need to know about xarelto® ? °Take your Xarelto® ONCE DAILY at the same time every day. °You may take it either with or without food. ° °If you have difficulty swallowing the tablet whole, you may crush it and mix in applesauce just prior to taking your dose. ° °Take Xarelto® exactly as prescribed by your doctor and DO NOT stop taking Xarelto® without talking to the doctor who prescribed the medication.  Stopping without other VTE prevention medication to take the place of Xarelto® may increase your risk of developing a clot. ° °After discharge, you should have regular check-up appointments with your healthcare provider that is prescribing your Xarelto®.   ° °What do you do if you miss a dose? °If you miss a dose, take it as soon as you remember on the same day then continue your regularly scheduled once daily regimen the next day. Do not take two doses of Xarelto® on the same day.  ° °Important Safety Information °A possible side effect of Xarelto® is bleeding. You should call your healthcare provider right away if you experience any of the following: °? Bleeding from an injury or your nose that does not stop. °? Unusual colored urine (red or dark brown) or unusual colored stools (red or black). °? Unusual bruising for unknown reasons. °? A serious fall or if you hit your head (even if there is no bleeding). ° °Some medicines may interact with Xarelto® and might increase your risk of bleeding while on Xarelto®. To help avoid  this, consult your healthcare provider or pharmacist prior to using any new prescription or non-prescription medications, including herbals, vitamins, non-steroidal anti-inflammatory drugs (NSAIDs) and supplements. ° °This website has more information on Xarelto®: www.xarelto.com. ° ° °

## 2014-03-05 NOTE — Progress Notes (Signed)
Dr August Saucerean notified of patients poor performance with PT. Pt extremely diaphoretic, hypotensive, and nauseated. Will admit to inpatient and continue with CPM PT and pain control at this time

## 2014-03-05 NOTE — Progress Notes (Signed)
Physical Therapy Note  Eval complete and to follow;  Of note, pt with dizziness, diaphoresis in standing;   Serial BPs as follows:   03/05/14 1113 03/05/14 1121 03/05/14 1129  Vital Signs  BP 135/77 mmHg (!) 128/91 mmHg 106/72 mmHg  Patient Position (if appropriate) Lying Sitting Standing     03/05/14 1131  Vital Signs  BP 132/70 mmHg  Patient Position (if appropriate) Sitting   Van ClinesHolly Larken Urias, PT  Acute Rehabilitation Services Pager [phone] Office [phone]

## 2014-03-06 ENCOUNTER — Encounter (HOSPITAL_COMMUNITY): Payer: Self-pay | Admitting: Orthopedic Surgery

## 2014-03-06 DIAGNOSIS — S83512A Sprain of anterior cruciate ligament of left knee, initial encounter: Secondary | ICD-10-CM | POA: Diagnosis not present

## 2014-03-06 MED ORDER — DIPHENHYDRAMINE HCL 25 MG PO CAPS
25.0000 mg | ORAL_CAPSULE | Freq: Four times a day (QID) | ORAL | Status: DC | PRN
  Administered 2014-03-06: 25 mg via ORAL
  Filled 2014-03-06: qty 1

## 2014-03-06 MED ORDER — OXYCODONE HCL ER 10 MG PO T12A: 10.0000 mg | EXTENDED_RELEASE_TABLET | Freq: Two times a day (BID) | ORAL | Status: DC

## 2014-03-06 MED ORDER — WHITE PETROLATUM GEL
Status: AC
Start: 2014-03-06 — End: 2014-03-06
  Administered 2014-03-06: 21:00:00
  Filled 2014-03-06: qty 1

## 2014-03-06 NOTE — Progress Notes (Signed)
Pt stable - difficult time with mobilizing yesterday but feels better today Dressing changed incisions ok On xarelto Plan dc today after PT

## 2014-03-06 NOTE — Progress Notes (Signed)
Orthopedic Tech Progress Note Patient Details:  Brian BentBrandon Juarez 1990-07-26 [id number] delivered Ortho Devices Type of Ortho Device: Other (comment) Ortho Device/Splint Location: Bone foam Ortho Device/Splint Interventions: Ordered   [place] R Thompson 03/06/2014, 10:29 AM

## 2014-03-06 NOTE — Care Management Note (Signed)
    Page 1 of 1   03/06/2014     4:09:48 PM CARE MANAGEMENT NOTE 03/06/2014  Patient:  Ala BentCLYBURN,Quran   Account Number:  [id number]  Date Initiated:  03/06/2014  Documentation initiated by:  [facility]  Subjective/Objective Assessment:   Left ACL reconstruction/ Medial meniscal tear repair/ Hamstring repair     Action/Plan:   home with parents   Anticipated DC Date:  03/06/2014   Anticipated DC Plan:  HOME/SELF CARE      DC Planning Services  CM consult      Choice offered to / List presented to:             Status of service:  Completed, signed off Medicare Important Message given?   (If response is "NO", the following Medicare IM given date fields will be blank) Date Medicare IM given:   Medicare IM given by:   Date Additional Medicare IM given:   Additional Medicare IM given by:    Discharge Disposition:  HOME/SELF CARE  Per UR Regulation:    If discussed at Long Length of Stay Meetings, dates discussed:    Comments:  03/06/14 16;00 CM called [facility] DME rep to please deliver the rolling walker to room.  Director of ortho unit has called both ortho techs and ED to find bari-crutches (for height); but not in supply in this hospital.  CM notified RN to please call MD to make aware as this will delay discharge. Freddy JakschSarah Sabrea Sankey, BSN, [place] [phone].  03/06/2013 1230 Pt will follow up with outpt PT after he follow up with surgeon. Pt has CPM at home delivered by TNT and crutches in the room. Pt states crutches are too short. Contacted [facility] to see if they can order crutches for his height 6\' 8" . Pt will need RW and 3n1 also.   Isidoro DonningAlesia Shavis RN CCM Case Mgmt phone [phone]

## 2014-03-06 NOTE — Progress Notes (Signed)
Physical Therapy Treatment Patient Details Name: Brian Juarez MRN: [id number] DOB: 1990/03/10 Today's Date: 03/06/2014    History of Present Illness HPI: Brian Juarez is a 24 year old patient with left knee pain and instability. He sustained an injury to his knee several months ago. MRI scanning did show anterior cruciate ligament tear and medial meniscal tear which is a significantly large tear. He's had some symptoms of symptomatic instability since that time we discussed the case with him in the clinic did say that his maternal grandfather did have clotting issues. This morning when talking with his mother she says that she does have factor V hypercoagulability issues. The patient himself has never had any issues with blood clotting. He does want to resume playing basketball. s/p ACL repair with hamstring autograft, and meniscal repair; Orthostatic BPs on eval     PT Comments    Patient continues to be highly limited by pain and self limited due to anticipatory pain. Will continue to do what we can with patient but he is not progressing. Attempted RW but patient unable to stand due to pain at that point.   Follow Up Recommendations   (when set up by MD after follow up)     Equipment Recommendations  Crutches;3in1 (PT);Rolling walker with 5" wheels    Recommendations for Other Services       Precautions / Restrictions Precautions Precautions: Fall Precaution Comments: watch for symptoms of orthostatic hypotension Required Braces or Orthoses: Knee Immobilizer - Left Knee Immobilizer - Left: On when out of bed or walking Restrictions LLE Weight Bearing: Weight bearing as tolerated    Mobility  Bed Mobility Overal bed mobility: Needs Assistance Bed Mobility: Supine to Sit     Supine to sit: Min assist     General bed mobility comments: Cues for technique; gave options fo rcontrolling LLE coming off of the bed including using RLE to assist, or the handle of the crutch to control;  Min assist for RLEup into bed  Transfers Overall transfer level: Needs assistance Equipment used: Crutches   Sit to Stand: Min assist         General transfer comment: Cues for technique and crutch management; Min assist to ensure stability. Increased effort. Patient yelling out in pain and shaking  Ambulation/Gait Ambulation/Gait assistance: Min guard Ambulation Distance (Feet):  (2 steps ) Assistive device: Crutches Gait Pattern/deviations: Step-to pattern     General Gait Details: Patient able to take two steps forward prior to demanding to sit. Only one step back before sitting and shaking in pain. Patient stated " this ain't right". RN aware of pain   Stairs            Wheelchair Mobility    Modified Rankin (Stroke Patients Only)       Balance                                    Cognition Arousal/Alertness: Awake/alert Behavior During Therapy: WFL for tasks assessed/performed;Anxious Overall Cognitive Status: Within Functional Limits for tasks assessed                      Exercises      General Comments        Pertinent Vitals/Pain Pain Score: 10-Worst pain ever Pain Location: L knee Pain Descriptors / Indicators: Shooting;Throbbing Pain Intervention(s): Monitored during session;Premedicated before session;Patient requesting pain meds-RN notified    Home Living  Prior Function            PT Goals (current goals can now be found in the care plan section) Progress towards PT goals: Not progressing toward goals - comment    Frequency  Min 5X/week    PT Plan Current plan remains appropriate    Co-evaluation             End of Session Equipment Utilized During Treatment: Gait belt Activity Tolerance: Patient limited by pain Patient left: in bed;with call bell/phone within reach;with family/visitor present     Time: 9562-13081116-1145 PT Time Calculation (min) (ACUTE ONLY): 29  min  Charges:  $Therapeutic Activity: 23-37 mins                    G Codes:      Fredrich BirksRobinette, Julia Elizabeth 03/06/2014, 12:20 PM  03/06/2014 Fredrich Birksobinette, Julia Elizabeth PTA [phone] pager [phone] office

## 2014-03-06 NOTE — Progress Notes (Signed)
CARE MANAGEMENT NOTE 03/06/2014  Patient:  Brian Juarez,Brian Juarez   Account Number:  [id number]  Date Initiated:  03/06/2014  Documentation initiated by:  [facility]Kalima Saylor  Subjective/Objective Assessment:   Left ACL reconstruction/ Medial meniscal tear repair/ Hamstring repair     Action/Plan:   home with parents   Anticipated DC Date:  03/06/2014   Anticipated DC Plan:  HOME/SELF CARE      DC Planning Services  CM consult      Choice offered to / List presented to:             Status of service:  Completed, signed off Medicare Important Message given?   (If response is "NO", the following Medicare IM given date fields will be blank) Date Medicare IM given:   Medicare IM given by:   Date Additional Medicare IM given:   Additional Medicare IM given by:    Discharge Disposition:  HOME/SELF CARE  Per UR Regulation:    If discussed at Long Length of Stay Meetings, dates discussed:    Comments:  03/06/2013 1230 Pt will follow up with outpt PT after he follow up with surgeon. Pt has CPM at home delivered by TNT and crutches in the room. Pt states crutches are too short. Contacted [facility] to see if they can order crutches for his height 6\' 8" . Pt will need RW and 3n1 also.   Isidoro DonningAlesia Renesmae Donahey RN CCM Case Mgmt phone [phone]

## 2014-03-07 DIAGNOSIS — S83512A Sprain of anterior cruciate ligament of left knee, initial encounter: Secondary | ICD-10-CM | POA: Diagnosis not present

## 2014-03-07 LAB — FACTOR 5 LEIDEN

## 2014-03-07 NOTE — Progress Notes (Signed)
Physical Therapy Treatment Patient Details Name: Brian Juarez MRN: [id number] DOB: 02-22-1991 Today's Date: 03/07/2014    History of Present Illness HPI: Brian Juarez is a 24 year old patient with left knee pain and instability. He sustained an injury to his knee several months ago. MRI scanning did show anterior cruciate ligament tear and medial meniscal tear which is a significantly large tear. He's had some symptoms of symptomatic instability since that time we discussed the case with him in the clinic did say that his maternal grandfather did have clotting issues. This morning when talking with his mother she says that she does have factor V hypercoagulability issues. The patient himself has never had any issues with blood clotting. He does want to resume playing basketball. s/p ACL repair with hamstring autograft, and meniscal repair; Orthostatic BPs on eval     PT Comments    Patient remains self-limiting due to pain (and fear of pain).  Was able to ambulate 72' with RW today with max encouragement.  Will return today to attempt stairs.  Follow Up Recommendations  Outpatient PT     Equipment Recommendations  Rolling walker with 5" wheels;3in1 (PT) (Patient reports he does not want crutches. Using RW)    Recommendations for Other Services       Precautions / Restrictions Precautions Precautions: Fall Precaution Comments: watch for symptoms of orthostatic hypotension Required Braces or Orthoses: Knee Immobilizer - Left Knee Immobilizer - Left: On when out of bed or walking Restrictions Weight Bearing Restrictions: Yes LLE Weight Bearing: Weight bearing as tolerated    Mobility  Bed Mobility Overal bed mobility: Needs Assistance Bed Mobility: Supine to Sit;Sit to Supine     Supine to sit: Min assist Sit to supine: Min assist   General bed mobility comments: Instructed patient on donning KI on LLE.  Assist to move LLE off of and onto bed.  Transfers Overall transfer level:  Needs assistance Equipment used: Rolling walker (2 wheeled) Transfers: Sit to/from Stand Sit to Stand: Min assist         General transfer comment: Verbal cues for hand placement and technique.  Assist for balance/safety as patient powered up to standing.  Assist for LLE placement to return to sitting.  Ambulation/Gait Ambulation/Gait assistance: Min guard Ambulation Distance (Feet): 26 Feet Assistive device: Rolling walker (2 wheeled) Gait Pattern/deviations: Step-to pattern;Decreased stance time - left;Decreased step length - right;Decreased weight shift to left;Antalgic Gait velocity: Decreased Gait velocity interpretation: Below normal speed for age/gender General Gait Details: Verbal cues for safe use of RW and gait sequence.  Patient hesitent to put weight on LLE due to pain.     Stairs            Wheelchair Mobility    Modified Rankin (Stroke Patients Only)       Balance                                    Cognition Arousal/Alertness: Awake/alert Behavior During Therapy: WFL for tasks assessed/performed;Anxious Overall Cognitive Status: Within Functional Limits for tasks assessed                      Exercises      General Comments        Pertinent Vitals/Pain Pain Assessment: 0-10 Pain Score: 7  Pain Location: Lt knee Pain Descriptors / Indicators: Aching;Sharp Pain Intervention(s): Premedicated before session;Repositioned    Home Living  Prior Function            PT Goals (current goals can now be found in the care plan section) Progress towards PT goals: Progressing toward goals (Slowly)    Frequency  Min 5X/week    PT Plan Current plan remains appropriate;Equipment recommendations need to be updated    Co-evaluation             End of Session Equipment Utilized During Treatment: Gait belt;Left knee immobilizer Activity Tolerance: Patient limited by pain Patient left: in  bed;with call bell/phone within reach;with family/visitor present     Time: 4098-11911008-1034 PT Time Calculation (min) (ACUTE ONLY): 26 min  Charges:  $Gait Training: 23-37 mins                    G Codes:      Vena AustriaDavis, Kareena Arrambide H 03/07/2014, 11:33 AM Durenda HurtSusan H. Renaldo Fiddleravis, PT, [facility] CenterMBA Acute Rehab Services Pager [phone]

## 2014-03-07 NOTE — Progress Notes (Signed)
UR completed 

## 2014-03-07 NOTE — Progress Notes (Signed)
CARE MANAGEMENT NOTE 03/07/2014  Patient:  Brian Juarez,Brian Juarez   Account Number:  [id number]  Date Initiated:  03/06/2014  Documentation initiated by:  [facility]Jeanenne Licea  Subjective/Objective Assessment:   Left ACL reconstruction/ Medial meniscal tear repair/ Hamstring repair     Action/Plan:   home with parents   Anticipated DC Date:  03/06/2014   Anticipated DC Plan:  HOME/SELF CARE      DC Planning Services  CM consult      Choice offered to / List presented to:     DME arranged  [facility]      DME agency  [facility]        Status of service:  Completed, signed off Medicare Important Message given?   (If response is "NO", the following Medicare IM given date fields will be blank) Date Medicare IM given:   Medicare IM given by:   Date Additional Medicare IM given:   Additional Medicare IM given by:    Discharge Disposition:  HOME/SELF CARE  Per UR Regulation:    If discussed at Long Length of Stay Meetings, dates discussed:    Comments:  03/07/2013 1145 Per PT, pt able to ambulate with RW. Pt has 3n1 in room. Isidoro DonningAlesia Besnik Febus RN CCM Case Mgmt phone [phone]  03/06/14 16;00 CM called [facility] DME rep to please deliver the rolling walker to room.  Director of ortho unit has called both ortho techs and ED to find bari-crutches (for height); but not in supply in this hospital.  CM notified RN to please call MD to make aware as this will delay discharge. Freddy JakschSarah Jeffries, BSN, [place] [phone].  03/06/2013 1230 Pt will follow up with outpt PT after he follow up with surgeon. Pt has CPM at home delivered by TNT and crutches in the room. Pt states crutches are too short. Contacted [facility] to see if they can order crutches for his height 6\' 8" . Pt will need RW and 3n1 also.   Isidoro DonningAlesia Zia Kanner RN CCM Case Mgmt phone [phone]

## 2014-03-07 NOTE — Progress Notes (Signed)
Physical Therapy Treatment Patient Details Name: Brian Juarez MRN: [name] DOB: 09/24/1990 Today's Date: 03/07/2014    History of Present Illness HPI: Brian JunesBrandon is a 24 year old patient with left knee pain and instability. He sustained an injury to his knee several months ago. MRI scanning did show anterior cruciate ligament tear and medial meniscal tear which is a significantly large tear. He's had some symptoms of symptomatic instability since that time we discussed the case with him in the clinic did say that his maternal grandfather did have clotting issues. This morning when talking with his mother she says that she does have factor V hypercoagulability issues. The patient himself has never had any issues with blood clotting. He does want to resume playing basketball. s/p ACL repair with hamstring autograft, and meniscal repair; Orthostatic BPs on eval     PT Comments    Patient able to ambulate 30' with RW and min guard assist.  Able to negotiate 1 step to enter home with min guard assist.  Patient safe with use of RW.  Ready for d/c from PT perspective.  Follow Up Recommendations  Outpatient PT     Equipment Recommendations  Rolling walker with 5" wheels;3in1 (PT) (Patient reports he does not want crutches.  Using RW)    Recommendations for Other Services       Precautions / Restrictions Precautions Precautions: Fall Precaution Comments: watch for symptoms of orthostatic hypotension Required Braces or Orthoses: Knee Immobilizer - Left Knee Immobilizer - Left: On when out of bed or walking Restrictions Weight Bearing Restrictions: Yes LLE Weight Bearing: Weight bearing as tolerated    Mobility  Bed Mobility Overal bed mobility: Needs Assistance Bed Mobility: Supine to Sit;Sit to Supine     Supine to sit: Min assist Sit to supine: Min assist   General bed mobility comments: Instructed girlfriend to assist patient with moving LLE off of and onto bed.  Also instructed  patient and girlfriend on donning and doffing KI on LLE.  Transfers Overall transfer level: Needs assistance Equipment used: Rolling walker (2 wheeled) Transfers: Sit to/from Stand Sit to Stand: Min guard         General transfer comment: Assist for safety/balance only.  Ambulation/Gait Ambulation/Gait assistance: Min guard Ambulation Distance (Feet): 36 Feet Assistive device: Rolling walker (2 wheeled) Gait Pattern/deviations: Step-to pattern;Decreased stance time - left;Decreased step length - right;Decreased weight shift to left;Antalgic Gait velocity: Decreased Gait velocity interpretation: Below normal speed for age/gender General Gait Details: Verbal cues for safe use of RW and gait sequence.  Patient hesitent to put weight on LLE due to pain.     Stairs Stairs: Yes Stairs assistance: Min guard Stair Management: No rails;Step to pattern;Forwards;With walker Number of Stairs: 1 General stair comments: Instructed patient on safely negotiating 1 step with use of RW.  Patient able to complete with min guard.  Girlfriend instructed on proper guarding technique.  Wheelchair Mobility    Modified Rankin (Stroke Patients Only)       Balance                                    Cognition Arousal/Alertness: Awake/alert Behavior During Therapy: WFL for tasks assessed/performed;Anxious Overall Cognitive Status: Within Functional Limits for tasks assessed                      Exercises      General Comments  Pertinent Vitals/Pain Pain Assessment: 0-10 Pain Score: 5  Pain Location: Lt knee Pain Descriptors / Indicators: Aching Pain Intervention(s): Repositioned;Monitored during session    Home Living                      Prior Function            PT Goals (current goals can now be found in the care plan section) Progress towards PT goals: Progressing toward goals    Frequency  Min 5X/week    PT Plan Current plan  remains appropriate;Equipment recommendations need to be updated    Co-evaluation             End of Session Equipment Utilized During Treatment: Gait belt;Left knee immobilizer Activity Tolerance: Patient limited by pain Patient left: in bed;with call bell/phone within reach;with family/visitor present     Time: 1610-9604 PT Time Calculation (min) (ACUTE ONLY): 25 min  Charges:  $Gait Training: 23-37 mins                    G Codes:  Functional Assessment Tool Used: Clinical Judgement Functional Limitation: Mobility: Walking and moving around Mobility: Walking and Moving Around Goal Status [phone]): 0 percent impaired, limited or restricted Mobility: Walking and Moving Around Discharge Status [phone]): At least 1 percent but less than 20 percent impaired, limited or restricted   Brian Juarez 03/07/2014, 1:45 PM Durenda Hurt. Renaldo Fiddler, [facility] Pager [phone]

## 2014-03-08 NOTE — Discharge Summary (Signed)
Physician Discharge Summary  Patient ID: Brian BentBrandon Denner MRN: [id number] DOB/AGE: 10/03/1990 24 y.o.  Admit date: 03/04/2014 Discharge date: 03/07/2014  Admission Diagnoses:  Active Problems:   ACL tear   Discharge Diagnoses:  Same  Surgeries: Procedure(s): RECONSTRUCTION ANTERIOR CRUCIATE LIGAMENT (ACL) WITH HAMSTRING GRAFT, MEDIAL MENISCAL REPAIR. on 03/04/2014   Consultants:    Discharged Condition: Stable  Hospital Course: Brian Juarez is an 24 y.o. male who was admitted 03/04/2014 with a chief complaint of left knee pain, and found to have a diagnosis of left knee anterior cruciate ligament tear and medial meniscal tear.  They were brought to the operating room on 03/04/2014 and underwent the above named procedures. Patient tolerated procedure well and was started on CPM machine as well as Xarelto for DVT prophylaxis. Mother has factor V Leiden 5 condition. Patient was seen by physical therapy on postop day #1 and deemed to be unsafe for discharge. He made better progress by postoperative day #2; however, equipment needs delayed his discharge at that time due to his height of 68. He was discharged on Friday in good condition incisions were intact at that time he will go home with CPM machine to continue achieving increases in both full extension and flexion. He'll continue on Xarelto for 3 weeks postop.:  Anti-infectives    Start     Dose/Rate Route Frequency Ordered Stop   03/04/14 2000  ceFAZolin (ANCEF) IVPB 2 g/50 mL premix     2 g100 mL/hr over 30 Minutes Intravenous 3 times per day 03/04/14 1744 03/05/14 0716   03/04/14 0600  ceFAZolin (ANCEF) IVPB 2 g/50 mL premix     2 g100 mL/hr over 30 Minutes Intravenous On call to O.R. 03/03/14 1401 03/04/14 1201    .  Recent vital signs:  Filed Vitals:   03/07/14 0713  BP: 124/71  Pulse: 85  Temp: 97.5 F (36.4 C)  Resp: 16    Recent laboratory studies:  Results for orders placed or performed during the hospital encounter of  03/04/14  Factor 5 leiden  Result Value Ref Range   Interpretation-F5LEID: REPORT    Recommendations-F5LEID: REPORT    Reviewer REPORT     Discharge Medications:     Medication List    STOP taking these medications        ibuprofen 200 MG tablet  Commonly known as:  ADVIL,MOTRIN     naproxen 500 MG tablet  Commonly known as:  NAPROSYN      TAKE these medications        carbamide peroxide 6.5 % otic solution  Commonly known as:  DEBROX  Place 5 drops into both ears 2 (two) times daily.     methocarbamol 500 MG tablet  Commonly known as:  ROBAXIN  Take 1 tablet (500 mg total) by mouth every 6 (six) hours as needed for muscle spasms.     oxyCODONE 5 MG immediate release tablet  Commonly known as:  Oxy IR/ROXICODONE  Take 1-2 tablets (5-10 mg total) by mouth every 3 (three) hours as needed for breakthrough pain.     OxyCODONE 10 mg T12a 12 hr tablet  Commonly known as:  OXYCONTIN  Take 1 tablet (10 mg total) by mouth every 12 (twelve) hours.     rivaroxaban 10 MG Tabs tablet  Commonly known as:  XARELTO  Take 1 tablet (10 mg total) by mouth daily with supper.     tobramycin 0.3 % ophthalmic solution  Commonly known as:  TOBREX  Place 1  drop into the left eye every 6 (six) hours.        Diagnostic Studies: No results found.  Disposition: 01-Home or Self Care      Discharge Instructions    Call MD / Call 911    Complete by:  As directed   If you experience chest pain or shortness of breath, CALL 911 and be transported to the hospital emergency room.  If you develope a fever above 101 F, pus (white drainage) or increased drainage or redness at the wound, or calf pain, call your surgeon's office.     Constipation Prevention    Complete by:  As directed   Drink plenty of fluids.  Prune juice may be helpful.  You may use a stool softener, such as Colace (over the counter) 100 mg twice a day.  Use MiraLax (over the counter) for constipation as needed.     Diet -  low sodium heart healthy    Complete by:  As directed      Discharge instructions    Complete by:  As directed   Okay to weight-bear as tolerated in knee immobilizer on left hand side CPM machine 1 hour 3 times a day 0-45 today increase 5 per day as tolerated is 90 of flexion by 2 weeks Okay to be out of the knee immobilizer when recumbent for reclining in couch or bed. Used a bone foam around heel to work on achieving full knee extension during that time Keep incision dry okay to shower rewrap knee with Saran cling wrap     Increase activity slowly as tolerated    Complete by:  As directed               Signed: Coral Soler SCOTT 03/08/2014, 10:13 AM

## 2014-03-12 ENCOUNTER — Ambulatory Visit (HOSPITAL_COMMUNITY)
Admission: RE | Admit: 2014-03-12 | Discharge: 2014-03-12 | Disposition: A | Discharge status: Home / Self Care | Payer: BLUE CROSS/BLUE SHIELD | Source: Ambulatory Visit | Attending: Orthopedic Surgery | Admitting: Orthopedic Surgery

## 2014-03-12 ENCOUNTER — Other Ambulatory Visit (HOSPITAL_COMMUNITY): Payer: Self-pay | Admitting: Orthopedic Surgery

## 2014-03-12 DIAGNOSIS — M7989 Other specified soft tissue disorders: Secondary | ICD-10-CM | POA: Insufficient documentation

## 2014-03-12 DIAGNOSIS — M79605 Pain in left leg: Secondary | ICD-10-CM | POA: Diagnosis not present

## 2014-03-12 DIAGNOSIS — M25562 Pain in left knee: Secondary | ICD-10-CM

## 2014-03-12 NOTE — Progress Notes (Signed)
*  PRELIMINARY RESULTS* Vascular Ultrasound Left lower extremity venous duplex has been completed.  Preliminary findings: No evidence of DVT.  Called results to on -call physician since Dr. August Saucerean had left for the day. Patient was instructed to call the office in the morning with any further questions.   Farrel DemarkJill Eunice, RDMS, RVT  03/12/2014, 6:43 PM

## 2014-05-12 ENCOUNTER — Emergency Department (HOSPITAL_BASED_OUTPATIENT_CLINIC_OR_DEPARTMENT_OTHER)
Admission: EM | Admit: 2014-05-12 | Discharge: 2014-05-12 | Disposition: A | Discharge status: Home / Self Care | Payer: BLUE CROSS/BLUE SHIELD | Attending: Emergency Medicine | Admitting: Emergency Medicine

## 2014-05-12 ENCOUNTER — Encounter (HOSPITAL_BASED_OUTPATIENT_CLINIC_OR_DEPARTMENT_OTHER): Payer: Self-pay | Admitting: Emergency Medicine

## 2014-05-12 ENCOUNTER — Emergency Department (HOSPITAL_BASED_OUTPATIENT_CLINIC_OR_DEPARTMENT_OTHER): Payer: BLUE CROSS/BLUE SHIELD

## 2014-05-12 DIAGNOSIS — R69 Illness, unspecified: Secondary | ICD-10-CM

## 2014-05-12 DIAGNOSIS — J111 Influenza due to unidentified influenza virus with other respiratory manifestations: Secondary | ICD-10-CM

## 2014-05-12 DIAGNOSIS — Z792 Long term (current) use of antibiotics: Secondary | ICD-10-CM | POA: Insufficient documentation

## 2014-05-12 DIAGNOSIS — R509 Fever, unspecified: Secondary | ICD-10-CM | POA: Diagnosis present

## 2014-05-12 DIAGNOSIS — Z720 Tobacco use: Secondary | ICD-10-CM | POA: Insufficient documentation

## 2014-05-12 DIAGNOSIS — Z7901 Long term (current) use of anticoagulants: Secondary | ICD-10-CM | POA: Insufficient documentation

## 2014-05-12 DIAGNOSIS — Z79899 Other long term (current) drug therapy: Secondary | ICD-10-CM | POA: Insufficient documentation

## 2014-05-12 DIAGNOSIS — Z87442 Personal history of urinary calculi: Secondary | ICD-10-CM | POA: Diagnosis not present

## 2014-05-12 MED ORDER — IBUPROFEN 800 MG PO TABS
800.0000 mg | ORAL_TABLET | Freq: Once | ORAL | Status: AC
  Administered 2014-05-12: 800 mg via ORAL
  Filled 2014-05-12: qty 1

## 2014-05-12 MED ORDER — OSELTAMIVIR PHOSPHATE 75 MG PO CAPS: 75.0000 mg | ORAL_CAPSULE | Freq: Two times a day (BID) | ORAL | Status: DC

## 2014-05-12 NOTE — ED Notes (Signed)
No vomiting after po fluids

## 2014-05-12 NOTE — ED Notes (Signed)
Pt states that he has been feeling bad for the last several days

## 2014-05-12 NOTE — ED Notes (Signed)
Pt states he is unable to let me do a strep screen. States he will bite the swab.

## 2014-05-12 NOTE — ED Provider Notes (Signed)
CSN: [id number]     Arrival date & time 05/12/14  1517 History  This chart was scribed for Glynn Octave, MD by Freida Busman, ED Scribe. This patient was seen in room MH11/MH11 and the patient's care was started 4:48 PM.    Chief Complaint  Patient presents with  . body aches/ fever     The history is provided by the patient. No language interpreter was used.     HPI Comments:  Brian Juarez is a 24 y.o. male who presents to the Emergency Department complaining of  Sore throat with moderate constant pain for 2 days. He reports associated generalized body aches which he describes as a throbbing/achy pain, intermittent HA, chills, productive cough with yellow and white sputum, subjective fever and  decreased PO intake. He notes mild CP with cough. He denies  hemoptysis. He also denies having a flu shot this season. Pt notes his girlfriend is currently sick.    Pt had right knee urgery Jan 5th 2016. He states he has been taking anti-inflammatories since,  with the exception of the last 3 days, but was taken off the a blood thinner a few weeks ago.  Past Medical History  Diagnosis Date  . Knee pain   . Headache     occasionally  . History of kidney stones    Past Surgical History  Procedure Laterality Date  . Anterior cruciate ligament repair Left 03/04/2014    Procedure: RECONSTRUCTION ANTERIOR CRUCIATE LIGAMENT (ACL) WITH HAMSTRING GRAFT, MEDIAL MENISCAL REPAIR.;  Surgeon: Cammy Copa, MD;  Location: [facility] OR;  Service: Orthopedics;  Laterality: Left;   Family History  Problem Relation Age of Onset  . Stroke Mother   . Hypertension Mother   . Heart disease Father   . Hyperlipidemia Father   . Hypertension Father   . Stroke Father   . Diabetes Maternal Grandmother   . Heart disease Maternal Grandmother   . Hyperlipidemia Maternal Grandmother   . Hypertension Maternal Grandmother   . Stroke Maternal Grandmother   . Cancer Maternal Grandfather   . Hyperlipidemia Maternal  Grandfather   . Diabetes Paternal Grandmother   . Hyperlipidemia Paternal Grandmother   . Stroke Paternal Grandmother   . Hyperlipidemia Paternal Grandfather   . Hypertension Paternal Grandfather    History  Substance Use Topics  . Smoking status: Current Some Day Smoker    Types: Cigarettes  . Smokeless tobacco: Never Used  . Alcohol Use: Yes     Comment: occasional    Review of Systems  Constitutional: Positive for fever, chills and appetite change.  HENT: Positive for sore throat.   Respiratory: Positive for cough.   Musculoskeletal: Positive for myalgias.  All other systems reviewed and are negative.     Allergies  Review of patient's allergies indicates no known allergies.  Home Medications   Prior to Admission medications   Medication Sig Start Date End Date Taking? Authorizing Provider  carbamide peroxide (DEBROX) 6.5 % otic solution Place 5 drops into both ears 2 (two) times daily. Patient not taking: Reported on 02/25/2014 12/31/13   Kathrynn Speed, PA-C  methocarbamol (ROBAXIN) 500 MG tablet Take 1 tablet (500 mg total) by mouth every 6 (six) hours as needed for muscle spasms. 03/05/14   Cammy Copa, MD  oseltamivir (TAMIFLU) 75 MG capsule Take 1 capsule (75 mg total) by mouth every 12 (twelve) hours. 05/12/14   Glynn Octave, MD  oxyCODONE (OXY IR/ROXICODONE) 5 MG immediate release tablet Take 1-2 tablets (  5-10 mg total) by mouth every 3 (three) hours as needed for breakthrough pain. 03/05/14   Cammy CopaScott Gregory Dean, MD  OxyCODONE (OXYCONTIN) 10 mg T12A 12 hr tablet Take 1 tablet (10 mg total) by mouth every 12 (twelve) hours. 03/06/14   Cammy CopaScott Gregory Dean, MD  rivaroxaban (XARELTO) 10 MG TABS tablet Take 1 tablet (10 mg total) by mouth daily with supper. 03/05/14   Cammy CopaScott Gregory Dean, MD  tobramycin (TOBREX) 0.3 % ophthalmic solution Place 1 drop into the left eye every 6 (six) hours. Patient not taking: Reported on 02/25/2014 10/18/13   Elvina SidleKurt Lauenstein, MD   BP 106/55  mmHg  Pulse 85  Temp(Src) 99.3 F (37.4 C) (Oral)  Resp 16  Ht 6\' 8"  (2.032 m)  Wt 215 lb (97.523 kg)  BMI 23.62 kg/m2  SpO2 97% Physical Exam  Constitutional: He is oriented to person, place, and time. He appears well-developed and well-nourished. No distress.  HENT:  Head: Normocephalic and atraumatic.  Mouth/Throat: Oropharynx is clear and moist. No oropharyngeal exudate.  Erythematous oropharynx   Eyes: Conjunctivae and EOM are normal. Pupils are equal, round, and reactive to light.  Neck: Normal range of motion. Neck supple.  No meningismus.  Cardiovascular: Normal rate, regular rhythm, normal heart sounds and intact distal pulses.   No murmur heard. Pulmonary/Chest: Effort normal and breath sounds normal. No respiratory distress.  Abdominal: Soft. There is no tenderness. There is no rebound and no guarding.  Musculoskeletal: Normal range of motion. He exhibits no edema or tenderness.  Neurological: He is alert and oriented to person, place, and time. No cranial nerve deficit. He exhibits normal muscle tone. Coordination normal.  No ataxia on finger to nose bilaterally. No pronator drift. 5/5 strength throughout. CN 2-12 intact. Negative Romberg. Equal grip strength. Sensation intact. Gait is normal.   Skin: Skin is warm. No rash noted.  Psychiatric: He has a normal mood and affect. His behavior is normal.  Nursing note and vitals reviewed.   ED Course  Procedures   DIAGNOSTIC STUDIES:  Oxygen Saturation is 99% on RA, normal by my interpretation.    COORDINATION OF CARE:  4:56 PM Discussed treatment plan with pt at bedside and pt agreed to plan.  Labs Review Labs Reviewed - No data to display  Imaging Review Dg Chest 2 View  05/12/2014   CLINICAL DATA:  Productive cough and sore throat  EXAM: CHEST  2 VIEW  COMPARISON:  None.  FINDINGS: Lungs are clear. Heart size and pulmonary vascularity are normal. No adenopathy. No bone lesions.  IMPRESSION: No edema or  consolidation.   Electronically Signed   By: Bretta BangWilliam  Woodruff III M.D.   On: 05/12/2014 17:05     EKG Interpretation None      MDM   Final diagnoses:  Influenza-like illness   2 days of body aches, chills, sore throat and fever. No documented temperature. Patient no longer on xarelto.   Sick contacts at home did not receive flu shot. Nontoxic, no meningismus  Patient refusing rapid strep. Chest x-ray negative. Suspect viral syndrome possibly influenza. Patient has been sick for 48 hours. Agreeable to trying Tamiflu after discussing risks and benefits. Tolerating by mouth in the ED. Advised PCP follow-up this week. Return precautions discussed.  I personally performed the services described in this documentation, which was scribed in my presence. The recorded information has been reviewed and is accurate.   Glynn OctaveStephen Dorothee Napierkowski, MD 05/13/14 [id number]

## 2014-05-12 NOTE — Discharge Instructions (Signed)

## 2014-07-26 ENCOUNTER — Encounter (HOSPITAL_COMMUNITY): Payer: Self-pay | Admitting: Emergency Medicine

## 2014-07-26 ENCOUNTER — Emergency Department (HOSPITAL_COMMUNITY)
Admission: EM | Admit: 2014-07-26 | Discharge: 2014-07-26 | Disposition: A | Discharge status: Home / Self Care | Payer: BLUE CROSS/BLUE SHIELD | Attending: Emergency Medicine | Admitting: Emergency Medicine

## 2014-07-26 ENCOUNTER — Emergency Department (HOSPITAL_COMMUNITY): Payer: BLUE CROSS/BLUE SHIELD

## 2014-07-26 DIAGNOSIS — Z720 Tobacco use: Secondary | ICD-10-CM | POA: Insufficient documentation

## 2014-07-26 DIAGNOSIS — N201 Calculus of ureter: Secondary | ICD-10-CM | POA: Insufficient documentation

## 2014-07-26 DIAGNOSIS — R109 Unspecified abdominal pain: Secondary | ICD-10-CM | POA: Diagnosis present

## 2014-07-26 DIAGNOSIS — Z79899 Other long term (current) drug therapy: Secondary | ICD-10-CM | POA: Insufficient documentation

## 2014-07-26 HISTORY — DX: Disorder of kidney and ureter, unspecified: N28.9

## 2014-07-26 LAB — CBC WITH DIFFERENTIAL/PLATELET
Basophils Absolute: 0 10*3/uL (ref 0.0–0.1)
Basophils Relative: 0 % (ref 0–1)
Eosinophils Absolute: 0 10*3/uL (ref 0.0–0.7)
Eosinophils Relative: 1 % (ref 0–5)
HCT: 43 % (ref 39.0–52.0)
HEMOGLOBIN: 14.2 g/dL (ref 13.0–17.0)
LYMPHS ABS: 1.3 10*3/uL (ref 0.7–4.0)
LYMPHS PCT: 24 % (ref 12–46)
MCH: 28.7 pg (ref 26.0–34.0)
MCHC: 33 g/dL (ref 30.0–36.0)
MCV: 86.9 fL (ref 78.0–100.0)
MONO ABS: 0.6 10*3/uL (ref 0.1–1.0)
MONOS PCT: 10 % (ref 3–12)
NEUTROS PCT: 65 % (ref 43–77)
Neutro Abs: 3.5 10*3/uL (ref 1.7–7.7)
Platelets: 188 10*3/uL (ref 150–400)
RBC: 4.95 MIL/uL (ref 4.22–5.81)
RDW: 13 % (ref 11.5–15.5)
WBC: 5.5 10*3/uL (ref 4.0–10.5)

## 2014-07-26 LAB — BASIC METABOLIC PANEL
ANION GAP: 7 (ref 5–15)
BUN: 13 mg/dL (ref 6–20)
CALCIUM: 9.3 mg/dL (ref 8.9–10.3)
CO2: 27 mmol/L (ref 22–32)
Chloride: 104 mmol/L (ref 101–111)
Creatinine, Ser: 1.15 mg/dL (ref 0.61–1.24)
GLUCOSE: 108 mg/dL — AB (ref 65–99)
Potassium: 3.9 mmol/L (ref 3.5–5.1)
SODIUM: 138 mmol/L (ref 135–145)

## 2014-07-26 LAB — URINALYSIS, ROUTINE W REFLEX MICROSCOPIC
Glucose, UA: NEGATIVE mg/dL
KETONES UR: NEGATIVE mg/dL
Nitrite: NEGATIVE
PH: 6 (ref 5.0–8.0)
Protein, ur: 100 mg/dL — AB
SPECIFIC GRAVITY, URINE: 1.025 (ref 1.005–1.030)
Urobilinogen, UA: 0.2 mg/dL (ref 0.0–1.0)

## 2014-07-26 LAB — URINE MICROSCOPIC-ADD ON

## 2014-07-26 MED ORDER — MORPHINE SULFATE 4 MG/ML IJ SOLN
4.0000 mg | Freq: Once | INTRAMUSCULAR | Status: AC
  Administered 2014-07-26: 4 mg via INTRAVENOUS
  Filled 2014-07-26: qty 1

## 2014-07-26 MED ORDER — IBUPROFEN 800 MG PO TABS: 800.0000 mg | ORAL_TABLET | Freq: Three times a day (TID) | ORAL | Status: DC | PRN

## 2014-07-26 MED ORDER — OXYCODONE-ACETAMINOPHEN 5-325 MG PO TABS: 1.0000 | ORAL_TABLET | ORAL | Status: DC | PRN

## 2014-07-26 MED ORDER — KETOROLAC TROMETHAMINE 30 MG/ML IJ SOLN
30.0000 mg | Freq: Once | INTRAMUSCULAR | Status: AC
  Administered 2014-07-26: 30 mg via INTRAVENOUS
  Filled 2014-07-26: qty 1

## 2014-07-26 MED ORDER — IBUPROFEN 800 MG PO TABS
800.0000 mg | ORAL_TABLET | Freq: Once | ORAL | Status: DC
  Filled 2014-07-26: qty 1

## 2014-07-26 MED ORDER — TAMSULOSIN HCL 0.4 MG PO CAPS: 0.4000 mg | ORAL_CAPSULE | Freq: Every day | ORAL | Status: DC

## 2014-07-26 MED ORDER — SODIUM CHLORIDE 0.9 % IV BOLUS (SEPSIS)
1000.0000 mL | Freq: Once | INTRAVENOUS | Status: AC
  Administered 2014-07-26: 1000 mL via INTRAVENOUS

## 2014-07-26 NOTE — ED Notes (Signed)
Pt advised that he has not drank anything and is still unable to provide urine. Pt advised that EDp may order catheter to obtain sample

## 2014-07-26 NOTE — ED Provider Notes (Signed)
CSN: [id number]     Arrival date & time 07/26/14  [id number] History   First MD Initiated Contact with Patient 07/26/14 [phone]     Chief Complaint  Patient presents with  . Flank Pain     (Consider location/radiation/quality/duration/timing/severity/associated sxs/prior Treatment) HPI  24 year old male presents with right-sided flank pain that started last night. She states he has also noticed hematuria. This feels similar to kidney stones that he has had twice before. The patient was given 200 g fentanyl and 4 mg Zofran IV by EMS prior to arrival. Denies fevers or chills. No current nausea but states he has been spitting up some. No diarrhea. Patient is currently somnolent but that apparently started after the fentanyl administration.  Past Medical History  Diagnosis Date  . Knee pain   . Headache     occasionally  . History of kidney stones   . Renal disorder     kidney stones   Past Surgical History  Procedure Laterality Date  . Anterior cruciate ligament repair Left 03/04/2014    Procedure: RECONSTRUCTION ANTERIOR CRUCIATE LIGAMENT (ACL) WITH HAMSTRING GRAFT, MEDIAL MENISCAL REPAIR.;  Surgeon: Cammy CopaGregory Keavon Sensing Dean, MD;  Location: [facility] OR;  Service: Orthopedics;  Laterality: Left;   Family History  Problem Relation Age of Onset  . Stroke Mother   . Hypertension Mother   . Heart disease Father   . Hyperlipidemia Father   . Hypertension Father   . Stroke Father   . Diabetes Maternal Grandmother   . Heart disease Maternal Grandmother   . Hyperlipidemia Maternal Grandmother   . Hypertension Maternal Grandmother   . Stroke Maternal Grandmother   . Cancer Maternal Grandfather   . Hyperlipidemia Maternal Grandfather   . Diabetes Paternal Grandmother   . Hyperlipidemia Paternal Grandmother   . Stroke Paternal Grandmother   . Hyperlipidemia Paternal Grandfather   . Hypertension Paternal Grandfather    History  Substance Use Topics  . Smoking status: Current Some Day Smoker    Types:  Cigarettes  . Smokeless tobacco: Never Used  . Alcohol Use: Yes     Comment: occasional    Review of Systems  Constitutional: Negative for fever.  Gastrointestinal: Positive for nausea. Negative for vomiting.  Genitourinary: Positive for hematuria and flank pain. Negative for dysuria.  All other systems reviewed and are negative.     Allergies  Review of patient's allergies indicates no known allergies.  Home Medications   Prior to Admission medications   Medication Sig Start Date End Date Taking? Authorizing Provider  carbamide peroxide (DEBROX) 6.5 % otic solution Place 5 drops into both ears 2 (two) times daily. Patient not taking: Reported on 02/25/2014 12/31/13   Kathrynn Speedobyn M Hess, PA-C  methocarbamol (ROBAXIN) 500 MG tablet Take 1 tablet (500 mg total) by mouth every 6 (six) hours as needed for muscle spasms. 03/05/14   Cammy CopaScott Gregory Dean, MD  oseltamivir (TAMIFLU) 75 MG capsule Take 1 capsule (75 mg total) by mouth every 12 (twelve) hours. 05/12/14   Glynn OctaveStephen Rancour, MD  oxyCODONE (OXY IR/ROXICODONE) 5 MG immediate release tablet Take 1-2 tablets (5-10 mg total) by mouth every 3 (three) hours as needed for breakthrough pain. 03/05/14   Cammy CopaScott Gregory Dean, MD  OxyCODONE (OXYCONTIN) 10 mg T12A 12 hr tablet Take 1 tablet (10 mg total) by mouth every 12 (twelve) hours. 03/06/14   Cammy CopaScott Gregory Dean, MD  rivaroxaban (XARELTO) 10 MG TABS tablet Take 1 tablet (10 mg total) by mouth daily with supper. 03/05/14  Cammy Copa, MD  tobramycin (TOBREX) 0.3 % ophthalmic solution Place 1 drop into the left eye every 6 (six) hours. Patient not taking: Reported on 02/25/2014 10/18/13   Elvina Sidle, MD   BP 124/69 mmHg  Temp(Src) 97.7 F (36.5 C) (Oral)  Resp 18  SpO2 100% Physical Exam  Constitutional: He is oriented to person, place, and time. He appears well-developed and well-nourished.  Somnolent but easily arousable and responds appropriately  HENT:  Head: Normocephalic and  atraumatic.  Right Ear: External ear normal.  Left Ear: External ear normal.  Nose: Nose normal.  Eyes: Right eye exhibits no discharge. Left eye exhibits no discharge.  Neck: Neck supple.  Cardiovascular: Normal rate, regular rhythm, normal heart sounds and intact distal pulses.   Pulmonary/Chest: Effort normal and breath sounds normal.  Abdominal: Soft. He exhibits no distension. There is no tenderness. There is no CVA tenderness.  Musculoskeletal: He exhibits no edema.  Neurological: He is alert and oriented to person, place, and time.  Skin: Skin is warm and dry.  Nursing note and vitals reviewed.   ED Course  Procedures (including critical care time) Labs Review Labs Reviewed  BASIC METABOLIC PANEL - Abnormal; Notable for the following:    Glucose, Bld 108 (*)    All other components within normal limits  URINALYSIS, ROUTINE W REFLEX MICROSCOPIC (NOT AT [facility]) - Abnormal; Notable for the following:    Color, Urine RED (*)    APPearance CLOUDY (*)    Hgb urine dipstick LARGE (*)    Bilirubin Urine SMALL (*)    Protein, ur 100 (*)    Leukocytes, UA MODERATE (*)    All other components within normal limits  URINE MICROSCOPIC-ADD ON - Abnormal; Notable for the following:    Squamous Epithelial / LPF FEW (*)    Bacteria, UA FEW (*)    Crystals CA OXALATE CRYSTALS (*)    All other components within normal limits  URINE CULTURE  CBC WITH DIFFERENTIAL/PLATELET    Imaging Review Dg Abd 1 View  07/26/2014   CLINICAL DATA:  Abdominal pain for 1 day with nausea, history of kidney stones  EXAM: ABDOMEN - 1 VIEW  COMPARISON:  07/09/2012  FINDINGS: Left renal calculus is again identified laterally. It measures approximately 11 mm. On the right there is a 6 mm calcific density identified projecting in the expected region of the proximal right ureter. This likely is the etiology of patient's underlying discomfort. No other calculi are seen.  IMPRESSION: Right proximal ureteral stone which  causes the hydronephrosis seen on recent ultrasound of the abdomen.  Left renal stones stable from previous exam.   Electronically Signed   By: Alcide Clever M.D.   On: 07/26/2014 15:55   US Renal  07/26/2014   CLINICAL DATA:  Right-sided flank pain since last night and history of renal calculi.  EXAM: RENAL / URINARY TRACT ULTRASOUND COMPLETE  COMPARISON:  None.  FINDINGS: Right Kidney:  Length: 11.1 cm. Moderate right hydronephrosis identified. No calculi are seen within the kidney or renal pelvis. No focal renal lesions.  Left Kidney:  Length: 11.6 cm. Mild prominence of the intrarenal collecting system without overt hydronephrosis. Both upper pole and lower pole shadowing calculi are identified. Upper pole calculus measures roughly 7 mm. The lower pole calculus is larger and is likely greater than 1 cm in diameter. No dilatation or calculi identified at the level of the renal pelvis.  Bladder:  The bladder is decompressed and contains  no visible calculi.  IMPRESSION: 1. Moderate right hydronephrosis. This could be secondary to a ureteral calculus given history. 2. Both upper pole and lower pole calculi are present in the left kidney without significant hydronephrosis identified.   Electronically Signed   By: Irish Lack M.D.   On: 07/26/2014 11:37     EKG Interpretation None      MDM   Final diagnoses:  Right ureteral stone    Patient with recurrent kidney stone. Pain controlled in ED. No vomiting, fever, dysuria or elevated WBC. U/s with hydro, unable to see stone. Has leukocytes in urine but no UTI symptoms. D/w urology, Dr. Marlou Porch, who recommends sending urine for culture but no treatment at this time. Requests KUB given history of radiolucent stones and will f/u in clinic. Discussed strict return precautions and need for re-evaluation if UTI symptoms develop.    Pricilla Loveless, MD 07/26/14 [phone]

## 2014-07-26 NOTE — ED Notes (Signed)
Pt sts that he is unable to provide urine at this time

## 2014-07-26 NOTE — ED Notes (Signed)
Ultrasound at bedside

## 2014-07-26 NOTE — ED Notes (Signed)
Bed: [id number] Expected date: 07/26/14 Expected time: 9:28 AM Means of arrival: Ambulance Comments: ? Kidney stones

## 2014-07-26 NOTE — ED Notes (Signed)
Pt made aware that we need a urine sample and was told if he did not get one for us soon, the EDP will put in an in and out catheter order to obtain a sample.

## 2014-07-26 NOTE — ED Notes (Addendum)
Pt from home via EMS c/o R flank that radiates to RLQ with hematuria that started yesterday. Pt has hx of the same. Pt is A&O and in NAD. Pt received 200 mcg Fentanyl and 4 Zofran en route

## 2014-07-26 NOTE — ED Notes (Signed)
Pt reports that he took 800 mg ibuprofen PTA

## 2014-07-26 NOTE — ED Notes (Signed)
Dr Criss AlvineGoldston aware that pt took ibuprofen PTA and sts that he does not want to give pt more meds at this time d/t pt still sedated and sleepy from 200 mcg Fentanyl given by EMS

## 2014-07-26 NOTE — ED Provider Notes (Signed)
He is awaiting x-ray was abdomen. History of left-sided kidney stones. Urine shows blood but no infection. Right-sided stone measures 6 mm. He would like to follow-up with urology. Given pain medicine and Flomax.  1. Right ureteral stone      Elwin MochaBlair Autry Prust, MD 07/26/14 [phone]

## 2014-07-27 LAB — URINE CULTURE: Colony Count: 4000

## 2015-07-17 ENCOUNTER — Emergency Department (HOSPITAL_COMMUNITY)
Admission: EM | Admit: 2015-07-17 | Discharge: 2015-07-18 | Disposition: A | Discharge status: Home / Self Care | Payer: BLUE CROSS/BLUE SHIELD | Attending: Emergency Medicine | Admitting: Emergency Medicine

## 2015-07-17 ENCOUNTER — Encounter (HOSPITAL_COMMUNITY): Payer: Self-pay | Admitting: Emergency Medicine

## 2015-07-17 DIAGNOSIS — Z79899 Other long term (current) drug therapy: Secondary | ICD-10-CM | POA: Insufficient documentation

## 2015-07-17 DIAGNOSIS — Y999 Unspecified external cause status: Secondary | ICD-10-CM | POA: Insufficient documentation

## 2015-07-17 DIAGNOSIS — Y939 Activity, unspecified: Secondary | ICD-10-CM | POA: Insufficient documentation

## 2015-07-17 DIAGNOSIS — S161XXA Strain of muscle, fascia and tendon at neck level, initial encounter: Secondary | ICD-10-CM | POA: Diagnosis not present

## 2015-07-17 DIAGNOSIS — Z79891 Long term (current) use of opiate analgesic: Secondary | ICD-10-CM | POA: Insufficient documentation

## 2015-07-17 DIAGNOSIS — M545 Low back pain, unspecified: Secondary | ICD-10-CM

## 2015-07-17 DIAGNOSIS — R519 Headache, unspecified: Secondary | ICD-10-CM

## 2015-07-17 DIAGNOSIS — S199XXA Unspecified injury of neck, initial encounter: Secondary | ICD-10-CM | POA: Diagnosis present

## 2015-07-17 DIAGNOSIS — R51 Headache: Secondary | ICD-10-CM | POA: Diagnosis not present

## 2015-07-17 DIAGNOSIS — F17210 Nicotine dependence, cigarettes, uncomplicated: Secondary | ICD-10-CM | POA: Insufficient documentation

## 2015-07-17 DIAGNOSIS — Y92410 Unspecified street and highway as the place of occurrence of the external cause: Secondary | ICD-10-CM | POA: Insufficient documentation

## 2015-07-17 MED ORDER — ONDANSETRON HCL 4 MG/2ML IJ SOLN
4.0000 mg | Freq: Once | INTRAMUSCULAR | Status: AC
  Administered 2015-07-17: 4 mg via INTRAVENOUS
  Filled 2015-07-17: qty 2

## 2015-07-17 MED ORDER — FENTANYL CITRATE (PF) 100 MCG/2ML IJ SOLN
50.0000 ug | INTRAMUSCULAR | Status: DC | PRN
  Administered 2015-07-17: 50 ug via INTRAVENOUS
  Filled 2015-07-17: qty 2
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
## 2015-07-18 ENCOUNTER — Emergency Department (HOSPITAL_COMMUNITY): Payer: BLUE CROSS/BLUE SHIELD

## 2015-07-18 DIAGNOSIS — S161XXA Strain of muscle, fascia and tendon at neck level, initial encounter: Secondary | ICD-10-CM | POA: Diagnosis not present

## 2015-07-18 MED ORDER — CYCLOBENZAPRINE HCL 10 MG PO TABS: 10.0000 mg | ORAL_TABLET | Freq: Two times a day (BID) | ORAL | Status: AC | PRN

## 2015-07-18 MED ORDER — IBUPROFEN 600 MG PO TABS: 600.0000 mg | ORAL_TABLET | Freq: Four times a day (QID) | ORAL | Status: AC | PRN
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
## 2016-01-01 IMAGING — CR DG ABDOMEN 1V
2 series · 2 of 2 positions shown · non-contrast
Comparison: 07/09/2012

CLINICAL DATA: Abdominal pain for 1 day with nausea, history of
kidney stones

EXAM:
ABDOMEN - 1 VIEW

[t abdomen supine (1 of 2)]
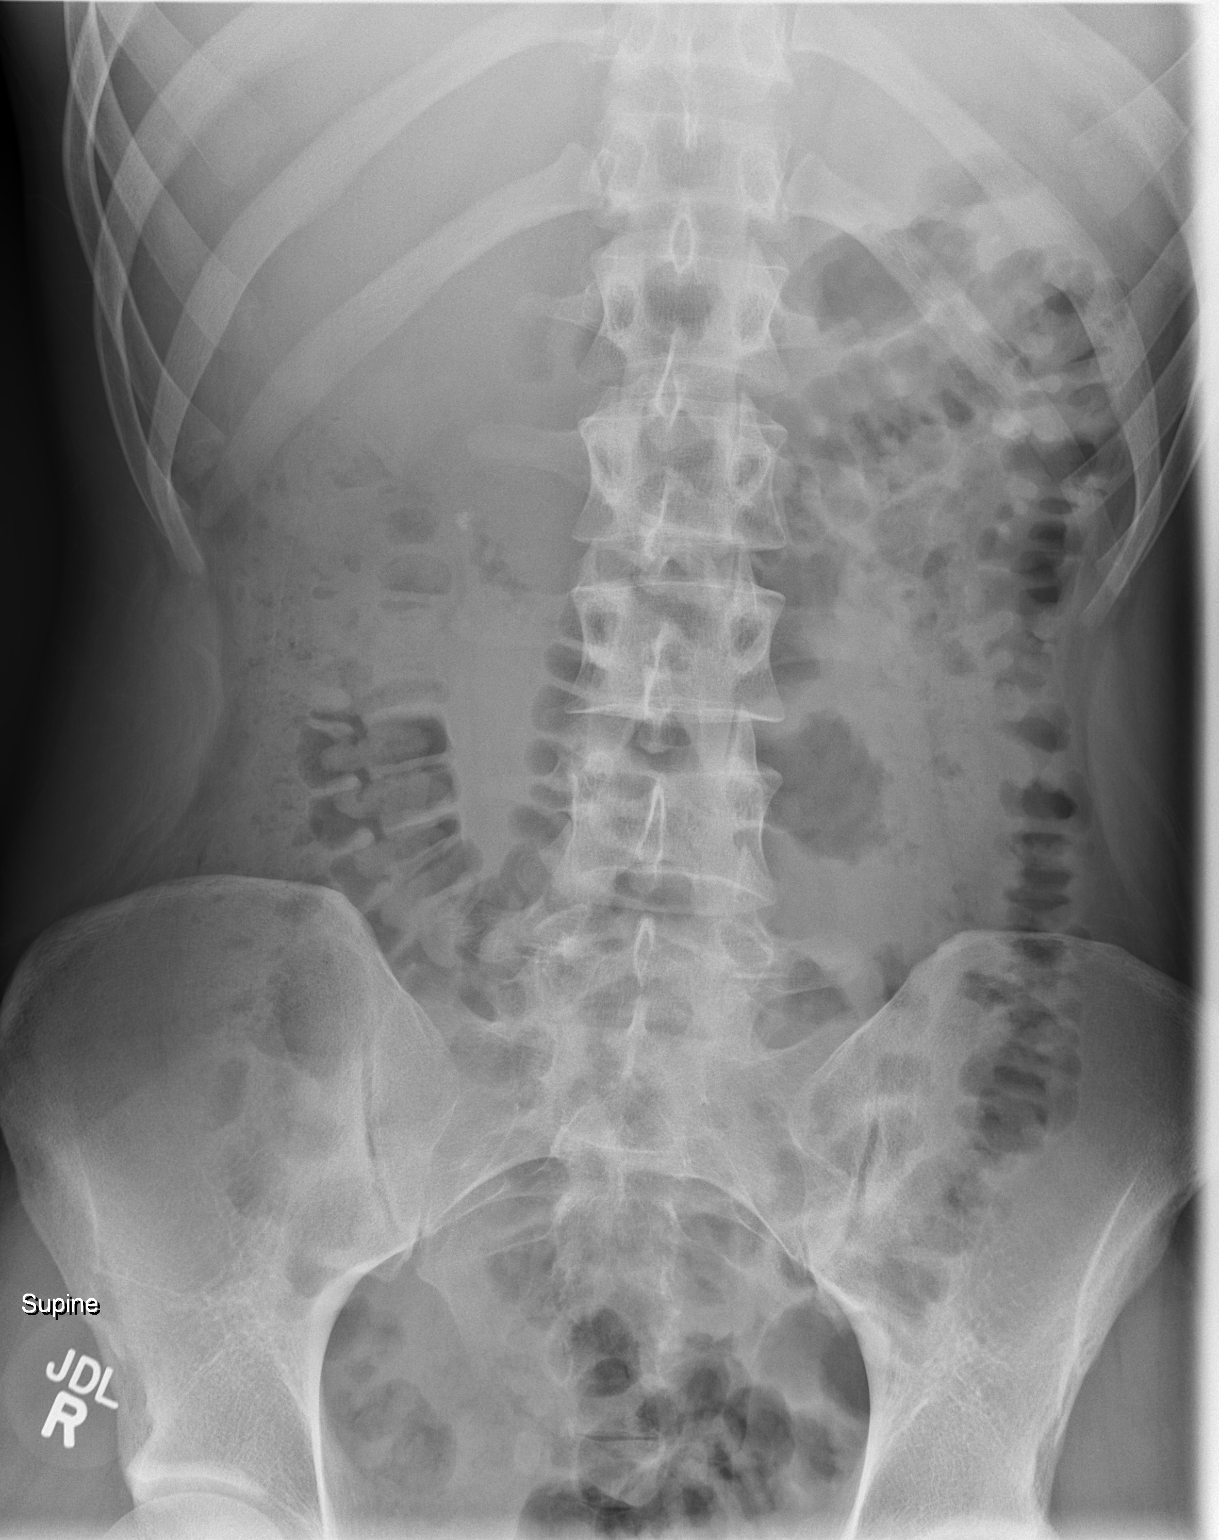

[t abdomen supine (2 of 2)]
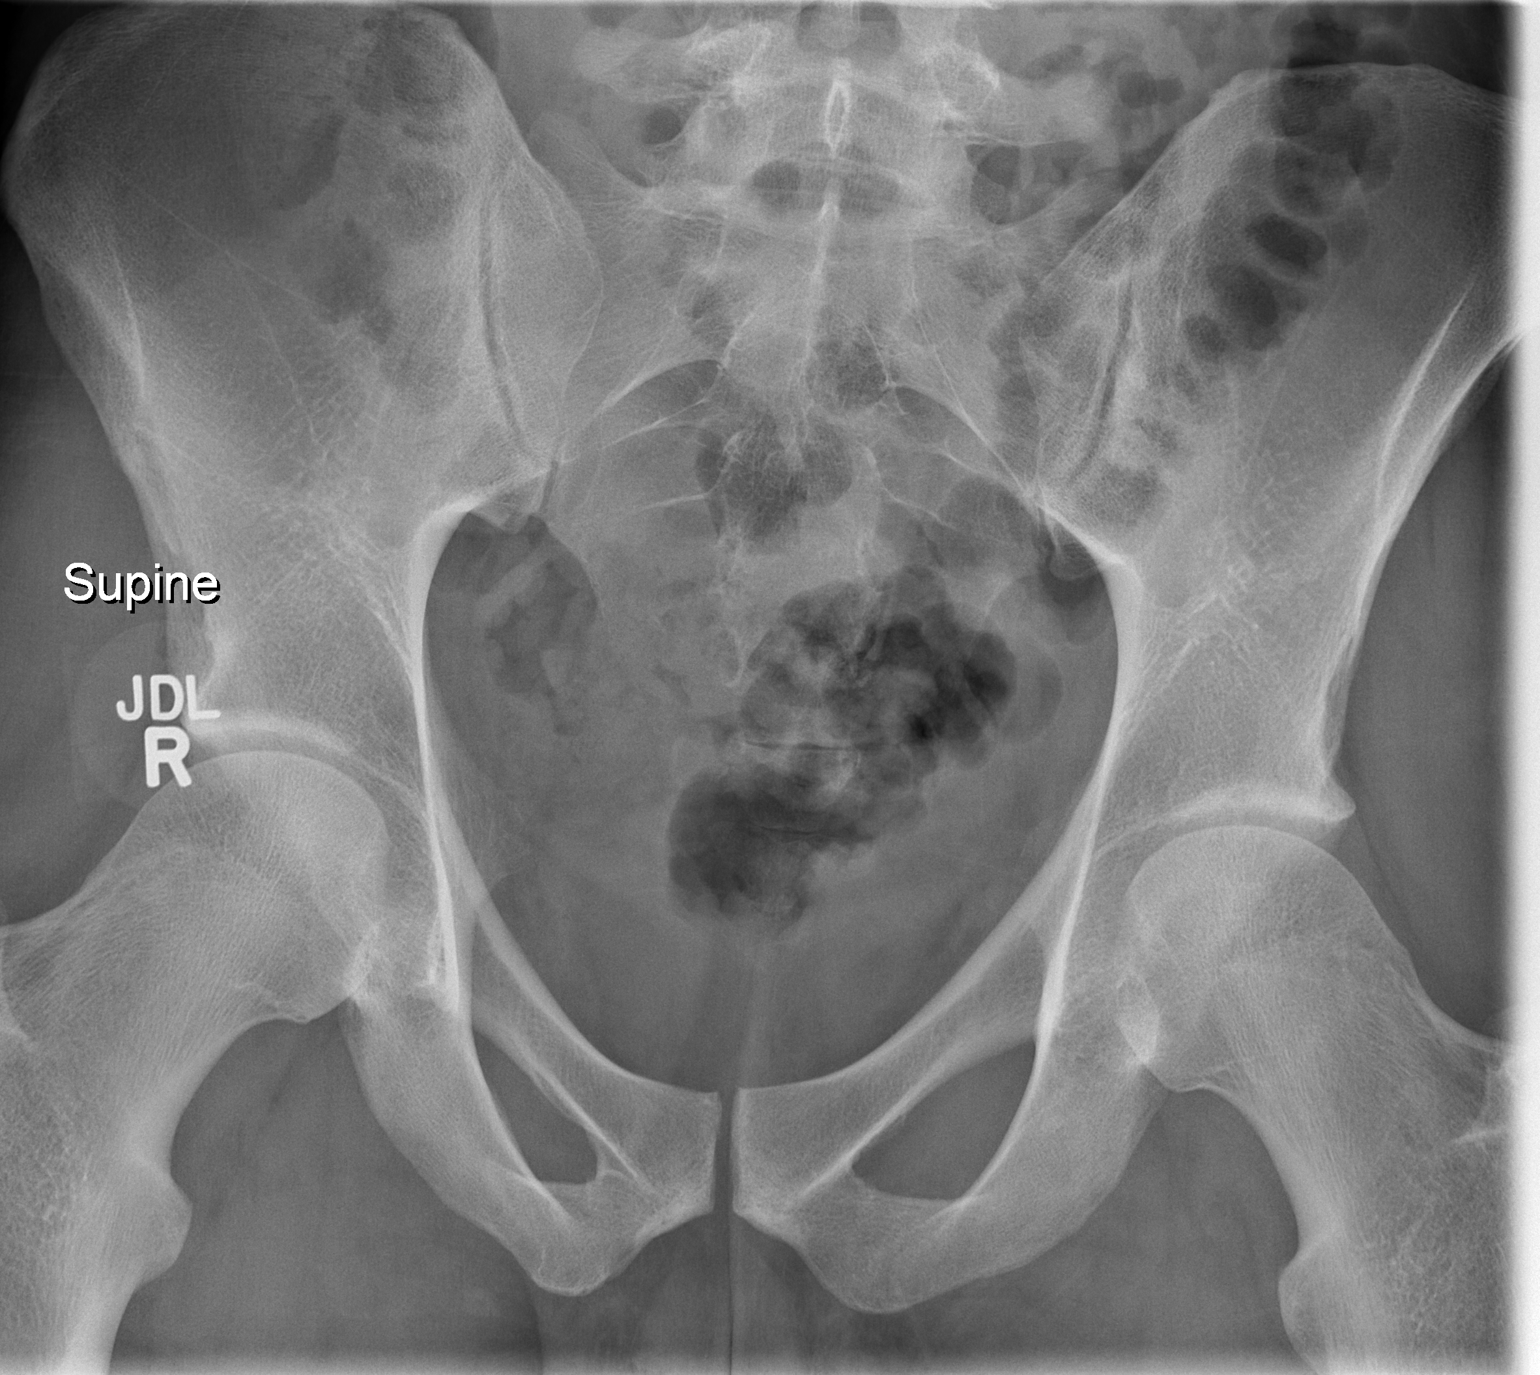

[2 of 2 positions shown; findings below may reference images not displayed]

FINDINGS: Left renal calculus is again identified laterally. It measures
approximately 11 mm. On the right there is a 6 mm calcific density
identified projecting in the expected region of the proximal right
ureter. This likely is the etiology of patient's underlying
discomfort. No other calculi are seen.
IMPRESSION: Right proximal ureteral stone which causes the hydronephrosis seen
on recent ultrasound of the abdomen.

Left renal stones stable from previous exam.
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
## 2019-04-29 ENCOUNTER — Ambulatory Visit: Payer: BLUE CROSS/BLUE SHIELD | Attending: Family

## 2019-04-29 DIAGNOSIS — Z20822 Contact with and (suspected) exposure to covid-19: Secondary | ICD-10-CM

## 2019-04-30 LAB — NOVEL CORONAVIRUS, NAA: SARS-CoV-2, NAA: NOT DETECTED
